# Patient Record
Sex: Female | Born: 1960 | Race: White | Hispanic: No | Marital: Married | Smoking: Never smoker
Health system: Southern US, Community
[De-identification: ages and names within clinical notes are randomized; demographics above are authoritative.]

## PROBLEM LIST (undated history)

## (undated) DIAGNOSIS — E213 Hyperparathyroidism, unspecified: Secondary | ICD-10-CM

## (undated) DIAGNOSIS — I10 Essential (primary) hypertension: Secondary | ICD-10-CM

## (undated) DIAGNOSIS — H9190 Unspecified hearing loss, unspecified ear: Secondary | ICD-10-CM

## (undated) DIAGNOSIS — Z974 Presence of external hearing-aid: Secondary | ICD-10-CM

## (undated) DIAGNOSIS — G4730 Sleep apnea, unspecified: Secondary | ICD-10-CM

## (undated) DIAGNOSIS — IMO0001 Reserved for inherently not codable concepts without codable children: Secondary | ICD-10-CM

## (undated) DIAGNOSIS — D649 Anemia, unspecified: Secondary | ICD-10-CM

## (undated) DIAGNOSIS — R12 Heartburn: Secondary | ICD-10-CM

## (undated) DIAGNOSIS — M1990 Unspecified osteoarthritis, unspecified site: Secondary | ICD-10-CM

## (undated) HISTORY — PX: WISDOM TOOTH EXTRACTION: SHX21

## (undated) HISTORY — DX: Anemia, unspecified: D64.9

## (undated) HISTORY — DX: Sleep apnea, unspecified: G47.30

## (undated) HISTORY — DX: Unspecified hearing loss, unspecified ear: H91.90

## (undated) HISTORY — PX: COLONOSCOPY: SHX174

## (undated) HISTORY — DX: Reserved for inherently not codable concepts without codable children: IMO0001

---

## 2011-08-03 ENCOUNTER — Encounter (HOSPITAL_COMMUNITY): Payer: Self-pay

## 2011-08-03 ENCOUNTER — Emergency Department (HOSPITAL_COMMUNITY)
Admission: EM | Admit: 2011-08-03 | Discharge: 2011-08-03 | Disposition: A | Discharge status: Home / Self Care | Attending: Emergency Medicine | Admitting: Emergency Medicine

## 2011-08-03 DIAGNOSIS — Z888 Allergy status to other drugs, medicaments and biological substances status: Secondary | ICD-10-CM | POA: Insufficient documentation

## 2011-08-03 DIAGNOSIS — I10 Essential (primary) hypertension: Secondary | ICD-10-CM | POA: Insufficient documentation

## 2011-08-03 HISTORY — DX: Essential (primary) hypertension: I10

## 2011-08-03 LAB — BASIC METABOLIC PANEL
CO2: 26 mEq/L (ref 19–32)
Calcium: 10 mg/dL (ref 8.4–10.5)
Glucose, Bld: 89 mg/dL (ref 70–99)
Sodium: 140 mEq/L (ref 135–145)

## 2011-08-03 MED ORDER — LISINOPRIL 10 MG PO TABS
10.0000 mg | ORAL_TABLET | Freq: Once | ORAL | Status: AC
  Administered 2011-08-03: 10 mg via ORAL
  Filled 2011-08-03: qty 1

## 2011-08-03 MED ORDER — LISINOPRIL 10 MG PO TABS: 10.0000 mg | ORAL_TABLET | Freq: Every day | ORAL | Status: DC

## 2011-08-03 MED ORDER — LISINOPRIL 10 MG PO TABS
ORAL_TABLET | ORAL | Status: AC
  Filled 2011-08-03: qty 1

## 2011-08-03 NOTE — ED Notes (Signed)
Pt says was at her eye doctor's office today and bp was over 200 systolic.  Pt says has had hypertension for several years but has not seen a doctor about it.  Pt denies other symptoms.  Says was at the eye doctor because it was a regular check up.

## 2011-08-06 NOTE — ED Provider Notes (Signed)
History     CSN: [id number]  Arrival date & time 08/03/11  1502   First MD Initiated Contact with Patient 08/03/11 1645      Chief Complaint  Patient presents with  . Hypertension    (Consider location/radiation/quality/duration/timing/severity/associated sxs/prior treatment) HPI Comments: Patient was at her routine yearly eye exam when her systolic bp was found to be over 200.  She was referred immediately here for further evaluation.    Patient is a 51 y.o. female presenting with hypertension. The history is provided by the patient.  Hypertension This is a chronic problem. Episode onset: She reports history of HTN,  having been on atenolol and maxide ,  but has taken no medications in over 3 years for this. Pertinent negatives include no abdominal pain, arthralgias, chest pain, congestion, fever, headaches, joint swelling, nausea, neck pain, numbness, rash, sore throat, urinary symptoms, visual change or weakness. Associated symptoms comments: She reports occasional peripheral edema, not currently..    Past Medical History  Diagnosis Date  . Hypertension     History reviewed. No pertinent past surgical history.  No family history on file.  History  Substance Use Topics  . Smoking status: Never Smoker   . Smokeless tobacco: Not on file  . Alcohol Use: No    OB History    Grav Para Term Preterm Abortions TAB SAB Ect Mult Living                  Review of Systems  Constitutional: Negative for fever.  HENT: Negative for congestion, sore throat and neck pain.   Eyes: Negative.  Negative for visual disturbance.  Respiratory: Negative for chest tightness and shortness of breath.   Cardiovascular: Negative for chest pain.  Gastrointestinal: Negative for nausea and abdominal pain.  Genitourinary: Negative.  Negative for hematuria, decreased urine volume and difficulty urinating.  Musculoskeletal: Negative for joint swelling and arthralgias.  Skin: Negative.  Negative for  rash and wound.  Neurological: Negative for dizziness, weakness, light-headedness, numbness and headaches.  Hematological: Negative.   Psychiatric/Behavioral: Negative.     Allergies  Ativan  Home Medications   Current Outpatient Rx  Name Route Sig Dispense Refill  . LISINOPRIL 10 MG PO TABS Oral Take 1 tablet (10 mg total) by mouth daily. 30 tablet 0    BP 221/96  Pulse 90  Temp 98.1 F (36.7 C) (Oral)  Resp 18  Ht 5\' 1"  (1.549 m)  Wt 213 lb (96.616 kg)  BMI 40.25 kg/m2  SpO2 100%  LMP 07/08/2011  Physical Exam  Nursing note and vitals reviewed. Constitutional: She appears well-developed and well-nourished.  HENT:  Head: Normocephalic and atraumatic.  Eyes: Conjunctivae and EOM are normal. Pupils are equal, round, and reactive to light.  Neck: Normal range of motion.  Cardiovascular: Normal rate, regular rhythm, normal heart sounds and intact distal pulses.   Pulmonary/Chest: Effort normal and breath sounds normal. She has no wheezes.  Abdominal: Soft. Bowel sounds are normal. There is no tenderness.  Musculoskeletal: Normal range of motion. She exhibits no edema.  Neurological: She is alert.  Skin: Skin is warm and dry.  Psychiatric: She has a normal mood and affect.    ED Course  Procedures (including critical care time)  Labs Reviewed  BASIC METABOLIC PANEL - Abnormal; Notable for the following:    GFR calc non Af Amer 72 (*)     GFR calc Af Amer 83 (*)     All other components within normal limits  LAB REPORT - SCANNED   No results found.   1. Hypertension       MDM  Reviewed lab results and discussed with pt.  Pt with apparent long standing HTN who has been non compliant with medication recommendations. She does have a pcp Dr Tanya Nones, but states has not seen in some time.  Strongly encouraged to get recheck by him within the next several weeks.  Prescribed lisinopril 10 mg daily.  Recommended pt to check her bp several times per week and discuss  these results with Dr. Tanya Nones who may adjust meds as needed.  Pt understands need for f/u and potential health risks of uncontrolled bp.  No evidence for acute end organ damage on today's exam or by history.        Burgess Amor, PA 08/06/11 1007

## 2011-08-07 NOTE — ED Provider Notes (Signed)
Medical screening examination/treatment/procedure(s) were performed by non-physician practitioner and as supervising physician I was immediately available for consultation/collaboration. Ashely Joshua, MD, FACEP   Ahsan Esterline L Shaliyah Taite, MD 08/07/11 2340 

## 2011-08-23 DIAGNOSIS — D649 Anemia, unspecified: Secondary | ICD-10-CM

## 2011-08-23 HISTORY — DX: Anemia, unspecified: D64.9

## 2012-10-13 ENCOUNTER — Ambulatory Visit (INDEPENDENT_AMBULATORY_CARE_PROVIDER_SITE_OTHER): Admitting: Physician Assistant

## 2012-10-13 ENCOUNTER — Encounter: Payer: Self-pay | Admitting: Physician Assistant

## 2012-10-13 VITALS — BP 118/80 | HR 100 | Temp 97.6°F | Resp 20 | Wt 206.0 lb

## 2012-10-13 DIAGNOSIS — H9193 Unspecified hearing loss, bilateral: Secondary | ICD-10-CM

## 2012-10-13 DIAGNOSIS — D649 Anemia, unspecified: Secondary | ICD-10-CM

## 2012-10-13 DIAGNOSIS — I10 Essential (primary) hypertension: Secondary | ICD-10-CM

## 2012-10-13 DIAGNOSIS — H9190 Unspecified hearing loss, unspecified ear: Secondary | ICD-10-CM

## 2012-10-13 DIAGNOSIS — IMO0001 Reserved for inherently not codable concepts without codable children: Secondary | ICD-10-CM | POA: Insufficient documentation

## 2012-10-13 LAB — CBC WITH DIFFERENTIAL/PLATELET
Eosinophils Absolute: 0.2 10*3/uL (ref 0.0–0.7)
Eosinophils Relative: 2 % (ref 0–5)
HCT: 42.4 % (ref 36.0–46.0)
Hemoglobin: 14.9 g/dL (ref 12.0–15.0)
Lymphs Abs: 2.1 10*3/uL (ref 0.7–4.0)
MCH: 30.3 pg (ref 26.0–34.0)
MCV: 86.2 fL (ref 78.0–100.0)
Monocytes Absolute: 0.5 10*3/uL (ref 0.1–1.0)
Monocytes Relative: 6 % (ref 3–12)
Platelets: 304 10*3/uL (ref 150–400)
RBC: 4.92 MIL/uL (ref 3.87–5.11)

## 2012-10-13 LAB — BASIC METABOLIC PANEL WITH GFR
BUN: 15 mg/dL (ref 6–23)
CO2: 26 mEq/L (ref 19–32)
Calcium: 10.8 mg/dL — ABNORMAL HIGH (ref 8.4–10.5)
Creat: 0.93 mg/dL (ref 0.50–1.10)
Glucose, Bld: 89 mg/dL (ref 70–99)

## 2012-10-13 NOTE — Progress Notes (Signed)
Patient ID: Courtney Hickman MRN: [id number], DOB: 09-06-1960, 52 y.o. Date of Encounter: 10/13/2012, 11:48 AM    Chief Complaint:  Chief Complaint  Patient presents with  . Follow-up    6 mos     HPI: 52 y.o. year old white female is very hard of hearing. She is here for routine followup.  She is taking all 3 blood pressure medicines as directed. Has no adverse effects. No lightheadedness. No lower extremity edema.  She does report that at the time that she was having problems with anemia in August 2013 she was having a lot of menstrual bleeding. Says that she was having a lot of spotting and bleeding even between the times of her regular cycle. Says that this has stopped since she had evaluation and procedure by GYN.( I reviewed her chart and I see that I still do not have those records).  She has no complaints today and says she's been feeling well.  Home Meds: See attached medication section for any medications that were entered at today's visit. The computer does not put those onto this list.The following list is a list of meds entered prior to today's visit.  Current meds: Lisinopril 20 mg 1 by mouth daily Amlodipine 10 mg 1 by mouth daily  HCTZ 25 mg 1 by mouth daily  No current outpatient prescriptions on file prior to visit.   No current facility-administered medications on file prior to visit.    Allergies:  Allergies  Allergen Reactions  . Ativan [Lorazepam] Other (See Comments)    REACTION: Headache, eye pain      Review of Systems: See HPI for pertinent ROS. All other ROS negative.    Physical Exam: Blood pressure 118/80, pulse 100, temperature 97.6 F (36.4 C), temperature source Oral, resp. rate 20, weight 206 lb (93.441 kg)., Body mass index is 38.94 kg/(m^2). General: Obese white female.  Appears in no acute distress. Neck: Supple. No thyromegaly. No lymphadenopathy. No carotid bruits. Lungs: Clear bilaterally to auscultation without wheezes, rales, or  rhonchi. Breathing is unlabored. Heart: Regular rhythm. No murmurs, rubs, or gallops. Abdomen: Soft, non-tender, non-distended with normoactive bowel sounds. No hepatomegaly. No rebound/guarding. No obvious abdominal masses. Msk:  Strength and tone normal for age. Extremities/Skin: Warm and dry. No clubbing or cyanosis. No edema.  Neuro: Alert and oriented X 3. Moves all extremities spontaneously. Gait is normal. CNII-XII grossly in tact. Psych:  Responds to questions appropriately with a normal affect.     ASSESSMENT AND PLAN:  52 y.o. year old female with  1. Hypertension At goal. Continue current medications - BASIC METABOLIC PANEL WITH GFR  2. Anemia August 2013 she was found to have anemia. She underwent GI evaluation by Dr. Audley Hose. A: she had colonoscopy on 10/04/11. This showed diverticula. Hemorrhoids. Otherwise normal. Repeat 10 years. B: She underwent endoscopy 10/04/11 which was normal C : capsule endoscopy 10/04/11 normal  As well she had GYN evaluation. I still do not have his records. The patient does report to me that at that time in August 2013 she was having menstrual bleeding between her periods. Also says the GYN removed a uterine fibroid. We'll recheck CBC now to make sure no anemia at this time. - CBC with Differential  3. Hearing impairment, bilateral 4. screening mammogram: She had this October 2013. She says that she's gotten a letter to schedule followup. I have encouraged her to call to follow this up. Screening lipid panel , CMET, TSH 8/13 normal  Immunizations: Tetanus was given here 09/19/11 Flu: Give now  Regular office visit 6 months or sooner if needed   Signed, [facility] [place], [place], [facility] 10/13/2012 11:48 AM

## 2012-10-20 ENCOUNTER — Encounter: Payer: Self-pay | Admitting: Family Medicine

## 2012-10-20 ENCOUNTER — Telehealth: Payer: Self-pay | Admitting: Physician Assistant

## 2012-10-20 MED ORDER — AMLODIPINE BESYLATE 10 MG PO TABS: 10.0000 mg | ORAL_TABLET | Freq: Every day | ORAL | Status: DC

## 2012-10-20 MED ORDER — HYDROCHLOROTHIAZIDE 25 MG PO TABS: 25.0000 mg | ORAL_TABLET | Freq: Every day | ORAL | Status: DC

## 2012-10-20 MED ORDER — LISINOPRIL 20 MG PO TABS: 20.0000 mg | ORAL_TABLET | Freq: Every day | ORAL | Status: DC

## 2012-10-20 NOTE — Telephone Encounter (Signed)
Amlodipine 10 mg HCTZ 25 mg Lisinopril 20 mg

## 2012-10-20 NOTE — Telephone Encounter (Signed)
Medication refilled per protocol. 

## 2013-01-05 ENCOUNTER — Ambulatory Visit (INDEPENDENT_AMBULATORY_CARE_PROVIDER_SITE_OTHER): Admitting: Family Medicine

## 2013-01-05 ENCOUNTER — Encounter: Payer: Self-pay | Admitting: Family Medicine

## 2013-01-05 VITALS — BP 130/80 | HR 78 | Temp 97.5°F | Resp 18 | Wt 208.0 lb

## 2013-01-05 DIAGNOSIS — J029 Acute pharyngitis, unspecified: Secondary | ICD-10-CM | POA: Insufficient documentation

## 2013-01-05 DIAGNOSIS — J069 Acute upper respiratory infection, unspecified: Secondary | ICD-10-CM

## 2013-01-05 MED ORDER — GUAIFENESIN-CODEINE 100-10 MG/5ML PO SOLN: 10.0000 mL | Freq: Four times a day (QID) | ORAL | Status: DC | PRN

## 2013-01-05 MED ORDER — AMOXICILLIN 500 MG PO CAPS: 500.0000 mg | ORAL_CAPSULE | Freq: Three times a day (TID) | ORAL | Status: DC

## 2013-01-05 NOTE — Assessment & Plan Note (Signed)
Per above also give Robitussin with codeine

## 2013-01-05 NOTE — Patient Instructions (Signed)
Take antibiotics as prescribed Robitussin with codiene F/U as needed

## 2013-01-05 NOTE — Assessment & Plan Note (Signed)
Her strep screen is negative however  based on exam and worsening symptoms I will treat her for pharyngitis. I will put her on amoxicillin

## 2013-01-05 NOTE — Progress Notes (Signed)
   Subjective:    Patient ID: Courtney Hickman, female    DOB: Jun 13, 1960, 52 y.o.   MRN: [id number]  HPI Patient here secondary to sore throat and cough with production for the past 8 days. She denies any fever. She's not had any sinus drainage. Her more concern is her throat is very painful with swallowing. She's been using Tussend DM with some improvement in her cough as it does break up the mucus.   She denies any shortness of breath or wheezing.   Review of Systems - per above   GEN- denies fatigue, fever, weight loss,weakness, recent illness HEENT- denies eye drainage, change in vision, nasal discharge, CVS- denies chest pain, palpitations RESP- denies SOB,+ cough, wheeze Neuro- denies headache, dizziness, syncope, seizure activity      Objective:   Physical Exam  GEN- NAD, alert and oriented x3 HEENT- PERRL, EOMI, non injected sclera, pink conjunctiva, MMM, post-oropharynx +injection/, no exudates TM clear bilat no effusion, no maxillary sinus tenderness, nares clear Neck- Supple, Shotty LAD CVS- RRR, no murmur RESP-CTAB EXT- No edema Pulses- Radial 2+         Assessment & Plan:

## 2013-04-13 ENCOUNTER — Ambulatory Visit: Admitting: Physician Assistant

## 2013-04-21 ENCOUNTER — Other Ambulatory Visit: Payer: Self-pay | Admitting: Physician Assistant

## 2013-04-22 NOTE — Telephone Encounter (Signed)
Refills sent for one month until follow up appt

## 2013-04-27 ENCOUNTER — Encounter: Payer: Self-pay | Admitting: Physician Assistant

## 2013-04-27 ENCOUNTER — Ambulatory Visit (INDEPENDENT_AMBULATORY_CARE_PROVIDER_SITE_OTHER): Admitting: Physician Assistant

## 2013-04-27 VITALS — BP 128/88 | HR 88 | Temp 98.6°F | Resp 18 | Ht 60.0 in | Wt 210.0 lb

## 2013-04-27 DIAGNOSIS — I10 Essential (primary) hypertension: Secondary | ICD-10-CM

## 2013-04-27 DIAGNOSIS — H9190 Unspecified hearing loss, unspecified ear: Secondary | ICD-10-CM

## 2013-04-27 DIAGNOSIS — D649 Anemia, unspecified: Secondary | ICD-10-CM

## 2013-04-27 DIAGNOSIS — E669 Obesity, unspecified: Secondary | ICD-10-CM

## 2013-04-27 LAB — BASIC METABOLIC PANEL WITH GFR
BUN: 11 mg/dL (ref 6–23)
CALCIUM: 10.6 mg/dL — AB (ref 8.4–10.5)
CO2: 26 mEq/L (ref 19–32)
Chloride: 103 mEq/L (ref 96–112)
Creat: 0.82 mg/dL (ref 0.50–1.10)
GFR, EST NON AFRICAN AMERICAN: 83 mL/min
GLUCOSE: 83 mg/dL (ref 70–99)
Potassium: 4.2 mEq/L (ref 3.5–5.3)
SODIUM: 139 meq/L (ref 135–145)

## 2013-04-27 MED ORDER — AMLODIPINE BESYLATE 10 MG PO TABS: ORAL_TABLET | ORAL | Status: DC

## 2013-04-27 MED ORDER — HYDROCHLOROTHIAZIDE 25 MG PO TABS: ORAL_TABLET | ORAL | Status: DC

## 2013-04-27 MED ORDER — LISINOPRIL 20 MG PO TABS: 20.0000 mg | ORAL_TABLET | Freq: Every day | ORAL | Status: DC

## 2013-04-27 NOTE — Progress Notes (Signed)
    Patient ID: Courtney Hickman MRN: [id number], DOB: Jul 30, 1960, 53 y.o. Date of Encounter: 04/27/2013, 11:56 AM    Chief Complaint:  Chief Complaint  Patient presents with  . 6 mth check up    not fasting     HPI: 53 y.o. year old female --very hard of hearing--for followup of hypertension. says she is still taking all 3 blood pressure medicines as directed. Still having no adverse effects. No lightheadedness and no lower extremity edema.     Home Meds: See attached medication section for any medications that were entered at today's visit. The computer does not put those onto this list.The following list is a list of meds entered prior to today's visit.   No current outpatient prescriptions on file prior to visit.   No current facility-administered medications on file prior to visit.    Allergies:  Allergies  Allergen Reactions  . Actifed Cold-Allergy [Chlorpheniramine-Phenyleph Er] Anaphylaxis    Eyes hurt / headache      Review of Systems: See HPI for pertinent ROS. All other ROS negative.    Physical Exam: Blood pressure 128/88, pulse 88, temperature 98.6 F (37 C), temperature source Oral, resp. rate 18, height 5' (1.524 m), weight 210 lb (95.255 kg), last menstrual period 04/06/2013., Body mass index is 41.01 kg/(m^2). General:  Obese WF. Appears in no acute distress. Neck: Supple. No thyromegaly. No lymphadenopathy. No carotid bruits. Lungs: Clear bilaterally to auscultation without wheezes, rales, or rhonchi. Breathing is unlabored. Heart: Regular rhythm. No murmurs, rubs, or gallops. Msk:  Strength and tone normal for age. Extremities/Skin: Warm and dry. No edema.  Neuro: Alert and oriented X 3. Moves all extremities spontaneously. Gait is normal. CNII-XII grossly in tact. Psych:  Responds to questions appropriately with a normal affect.     ASSESSMENT AND PLAN:  53 y.o. year old female with  1. Hearing impairment  2. Hypertension Blood pressure is at  goal. Cont current medications. Check labs to monitor. - BASIC METABOLIC PANEL WITH GFR - lisinopril (PRINIVIL,ZESTRIL) 20 MG tablet; Take 1 tablet (20 mg total) by mouth daily.  Dispense: 30 tablet; Refill: 5 - hydrochlorothiazide (HYDRODIURIL) 25 MG tablet; TAKE ONE TABLET BY MOUTH ONCE DAILY  Dispense: 30 tablet; Refill: 5 - amLODipine (NORVASC) 10 MG tablet; TAKE ONE TABLET BY MOUTH ONCE DAILY  Dispense: 30 tablet; Refill: 5  3. Obesity, unspecified  4. Anemia Last office visit 09/2012 checked CBC to followup. Blood count is now normal and there is no anemia. Please see that note for details of her history of anemia.  Immunizations: Tetanus was given here 09/19/2011   Routine office visit 6 months or sooner if needed.     Signed, [address] [place], [place], [facility] 04/27/2013 11:56 AM

## 2013-05-05 ENCOUNTER — Encounter: Payer: Self-pay | Admitting: Family Medicine

## 2013-06-23 ENCOUNTER — Encounter: Payer: Self-pay | Admitting: Family Medicine

## 2013-06-23 LAB — HM MAMMOGRAPHY: HM Mammogram: NORMAL

## 2013-10-28 ENCOUNTER — Encounter: Payer: Self-pay | Admitting: Physician Assistant

## 2013-10-28 ENCOUNTER — Ambulatory Visit (INDEPENDENT_AMBULATORY_CARE_PROVIDER_SITE_OTHER): Admitting: Physician Assistant

## 2013-10-28 VITALS — BP 112/76 | HR 96 | Temp 97.9°F | Resp 20 | Wt 209.0 lb

## 2013-10-28 DIAGNOSIS — H9190 Unspecified hearing loss, unspecified ear: Secondary | ICD-10-CM

## 2013-10-28 DIAGNOSIS — Z23 Encounter for immunization: Secondary | ICD-10-CM

## 2013-10-28 DIAGNOSIS — L239 Allergic contact dermatitis, unspecified cause: Secondary | ICD-10-CM

## 2013-10-28 DIAGNOSIS — L200 Besnier's prurigo: Secondary | ICD-10-CM

## 2013-10-28 DIAGNOSIS — I10 Essential (primary) hypertension: Secondary | ICD-10-CM

## 2013-10-28 DIAGNOSIS — E669 Obesity, unspecified: Secondary | ICD-10-CM

## 2013-10-28 LAB — BASIC METABOLIC PANEL WITH GFR
BUN: 15 mg/dL (ref 6–23)
CALCIUM: 10.2 mg/dL (ref 8.4–10.5)
CO2: 24 mEq/L (ref 19–32)
Chloride: 103 mEq/L (ref 96–112)
Creat: 1.02 mg/dL (ref 0.50–1.10)
GFR, EST AFRICAN AMERICAN: 74 mL/min
GFR, EST NON AFRICAN AMERICAN: 64 mL/min
GLUCOSE: 115 mg/dL — AB (ref 70–99)
Potassium: 4 mEq/L (ref 3.5–5.3)
SODIUM: 139 meq/L (ref 135–145)

## 2013-10-28 MED ORDER — PREDNISONE 20 MG PO TABS: ORAL_TABLET | ORAL | Status: DC

## 2013-10-28 NOTE — Progress Notes (Signed)
Patient ID: Courtney Hickman MRN: [id number], DOB: 1960-10-29, 53 y.o. Date of Encounter: 10/28/2013, 10:28 AM    Chief Complaint:  Chief Complaint  Patient presents with  . 53 mth check up    not fasting  . rash all over x 1 day     HPI: 53 y.o. year old female --very hard of hearing-- here for followup of hypertension.  She says she just started to develop this rash last night. Says that it started on her lower legs last night. Now it has spread up her legs and also is now on both of her arms her abdomen and chest. Says it is itchy. Says the only thing she can think of is that her husband recently bought a new different type of detergent. He recently washed some towels with that detergent and she thinks that she may have used one of those towels. She can think of no other new products or foods that she has been in contact with.  Says she is still taking all 3 blood pressure medicines as directed. Still having no adverse effects. No lightheadedness and no lower extremity edema.     Home Meds:    Outpatient Prescriptions Prior to Visit  Medication Sig Dispense Refill  . amLODipine (NORVASC) 10 MG tablet TAKE ONE TABLET BY MOUTH ONCE DAILY  30 tablet  5  . hydrochlorothiazide (HYDRODIURIL) 25 MG tablet TAKE ONE TABLET BY MOUTH ONCE DAILY  30 tablet  5  . lisinopril (PRINIVIL,ZESTRIL) 20 MG tablet Take 1 tablet (20 mg total) by mouth daily.  30 tablet  5  . Multiple Vitamin (MULTIVITAMIN) tablet Take 1 tablet by mouth daily.       No facility-administered medications prior to visit.     Allergies:  Allergies  Allergen Reactions  . Actifed Cold-Allergy [Chlorpheniramine-Phenyleph Er] Anaphylaxis    Eyes hurt / headache      Review of Systems: See HPI for pertinent ROS. All other ROS negative.    Physical Exam: Blood pressure 112/76, pulse 96, temperature 97.9 F (36.6 C), temperature source Oral, resp. rate 20, weight 209 lb (94.802 kg)., Body mass index is 40.82  kg/(m^2). General:  Obese WF. Appears in no acute distress. Neck: Supple. No thyromegaly. No lymphadenopathy. No carotid bruits. Lungs: Clear bilaterally to auscultation without wheezes, rales, or rhonchi. Breathing is unlabored. Heart: Regular rhythm. No murmurs, rubs, or gallops. Msk:  Strength and tone normal for age. Extremities/Skin: Warm and dry. No edema.  Pink splotchy papules/ urticaria scattered on both legs, both arms, abdomen. Some of the areas on her arms are especially urticarial and small whelps.  Neuro: Alert and oriented X 3. Moves all extremities spontaneously. Gait is normal. CNII-XII grossly in tact. Psych:  Responds to questions appropriately with a normal affect.     ASSESSMENT AND PLAN:  53 y.o. year old female with  1. Hearing impairment  2. Hypertension Blood pressure is at goal. Cont current medications. Check labs to monitor. - BASIC METABOLIC PANEL WITH GFR - lisinopril (PRINIVIL,ZESTRIL) 20 MG tablet; Take 1 tablet (20 mg total) by mouth daily.  Dispense: 30 tablet; Refill: 5 - hydrochlorothiazide (HYDRODIURIL) 25 MG tablet; TAKE ONE TABLET BY MOUTH ONCE DAILY  Dispense: 30 tablet; Refill: 5 - amLODipine (NORVASC) 10 MG tablet; TAKE ONE TABLET BY MOUTH ONCE DAILY  Dispense: 30 tablet; Refill: 5  3. Obesity, unspecified  4. Anemia  09/2012 checked CBC to followup. Blood count  normal and there was no anemia. Please  see that note for details of her history of anemia.  2. Allergic dermatitis - predniSONE (DELTASONE) 20 MG tablet; Take 3 daily for 2 days, then 2 daily for 2 days, then 1 daily for 2 days.  Dispense: 12 tablet; Refill: 0 She is to take the prednisone as directed. She is to follow up if rash does not resolve after completion of prednisone. She is to throw away the detergent that her husband bought and not use it any further.   Immunizations: Tetanus was given here 09/19/2011 Test influenza vaccine today and she'll receive influenza vaccine  this will be given today.  Routine office visit 6 months or sooner if needed.     Signed, [address] [place], [place], [facility] 10/28/2013 10:28 AM

## 2013-10-29 ENCOUNTER — Encounter: Payer: Self-pay | Admitting: Family Medicine

## 2013-11-02 ENCOUNTER — Other Ambulatory Visit: Payer: Self-pay | Admitting: Physician Assistant

## 2013-11-26 ENCOUNTER — Telehealth: Payer: Self-pay | Admitting: Family Medicine

## 2013-11-26 MED ORDER — LISINOPRIL 20 MG PO TABS: 20.0000 mg | ORAL_TABLET | Freq: Every day | ORAL | Status: DC

## 2013-11-26 NOTE — Telephone Encounter (Signed)
Medication refilled per protocol. 

## 2013-12-20 ENCOUNTER — Other Ambulatory Visit: Payer: Self-pay | Admitting: Physician Assistant

## 2013-12-20 DIAGNOSIS — I10 Essential (primary) hypertension: Secondary | ICD-10-CM

## 2013-12-21 NOTE — Telephone Encounter (Signed)
Medication refilled per protocol. 

## 2014-04-29 ENCOUNTER — Ambulatory Visit (INDEPENDENT_AMBULATORY_CARE_PROVIDER_SITE_OTHER): Admitting: Physician Assistant

## 2014-04-29 ENCOUNTER — Encounter: Payer: Self-pay | Admitting: Physician Assistant

## 2014-04-29 VITALS — BP 122/82 | HR 92 | Temp 98.3°F | Resp 20 | Ht 60.0 in | Wt 212.0 lb

## 2014-04-29 DIAGNOSIS — D649 Anemia, unspecified: Secondary | ICD-10-CM | POA: Diagnosis not present

## 2014-04-29 DIAGNOSIS — E669 Obesity, unspecified: Secondary | ICD-10-CM

## 2014-04-29 DIAGNOSIS — H9190 Unspecified hearing loss, unspecified ear: Secondary | ICD-10-CM

## 2014-04-29 DIAGNOSIS — I10 Essential (primary) hypertension: Secondary | ICD-10-CM

## 2014-04-29 MED ORDER — LISINOPRIL 20 MG PO TABS: 20.0000 mg | ORAL_TABLET | Freq: Every day | ORAL | Status: DC

## 2014-04-29 NOTE — Progress Notes (Signed)
Patient ID: DISA RIEDLINGER MRN: [id number], DOB: 08-24-1960, 54 y.o. Date of Encounter: 04/29/2014, 8:55 AM    Chief Complaint:  Chief Complaint  Patient presents with  . 6 mth check up    not fasting     HPI: 54 y.o. year old female --very hard of hearing-- here for followup of hypertension.   Says she is still taking all 3 blood pressure medicines as directed. Still having no adverse effects. No lightheadedness and no lower extremity edema. No  complaints or concerns today.     Home Meds:    Outpatient Prescriptions Prior to Visit  Medication Sig Dispense Refill  . amLODipine (NORVASC) 10 MG tablet TAKE ONE TABLET BY MOUTH ONCE DAILY 30 tablet 5  . Ferrous Sulfate (IRON) 325 (65 FE) MG TABS Take 1 tablet by mouth daily.    . hydrochlorothiazide (HYDRODIURIL) 25 MG tablet TAKE ONE TABLET BY MOUTH ONCE DAILY 30 tablet 5  . lisinopril (PRINIVIL,ZESTRIL) 20 MG tablet Take 1 tablet (20 mg total) by mouth daily. 30 tablet 5  . Multiple Vitamin (MULTIVITAMIN) tablet Take 1 tablet by mouth daily.    . predniSONE (DELTASONE) 20 MG tablet Take 3 daily for 2 days, then 2 daily for 2 days, then 1 daily for 2 days. 12 tablet 0   No facility-administered medications prior to visit.     Allergies:  Allergies  Allergen Reactions  . Actifed Cold-Allergy [Chlorpheniramine-Phenyleph Er] Anaphylaxis    Eyes hurt / headache      Review of Systems: See HPI for pertinent ROS. All other ROS negative.    Physical Exam: Blood pressure 122/82, pulse 92, temperature 98.3 F (36.8 C), temperature source Oral, resp. rate 20, height 5' (1.524 m), weight 212 lb (96.163 kg)., Body mass index is 41.4 kg/(m^2). General:  Obese WF. Appears in no acute distress. Neck: Supple. No thyromegaly. No lymphadenopathy. No carotid bruits. Lungs: Clear bilaterally to auscultation without wheezes, rales, or rhonchi. Breathing is unlabored. Heart: Regular rhythm. No murmurs, rubs, or gallops. Msk:  Strength  and tone normal for age. Extremities/Skin: Warm and dry. No edema.   Neuro: Alert and oriented X 3. Moves all extremities spontaneously. Gait is normal. CNII-XII grossly in tact. Psych:  Responds to questions appropriately with a normal affect.     ASSESSMENT AND PLAN:  54 y.o. year old female with  1. Hearing impairment  2. Hypertension Blood pressure is at goal. Cont current medications.   - lisinopril (PRINIVIL,ZESTRIL) 20 MG tablet; Take 1 tablet (20 mg total) by mouth daily.  Dispense: 30 tablet; Refill: 5 - hydrochlorothiazide (HYDRODIURIL) 25 MG tablet; TAKE ONE TABLET BY MOUTH ONCE DAILY  Dispense: 30 tablet; Refill: 5 - amLODipine (NORVASC) 10 MG tablet; TAKE ONE TABLET BY MOUTH ONCE DAILY  Dispense: 30 tablet; Refill: 5  3. Obesity, unspecified  4. Anemia  09/2012 checked CBC to followup. Blood count  normal and there was no anemia. Please see that note for details of her history of anemia.   Immunizations: Influenza Vaccine: Given here 10/28/2013 Tetanus Vaccine:  Given here 09/19/2011   Today discussed that we need to update preventive care. Patient agreeable that she has not had a complete physical in quite some time and is agreeable to schedule this. I discussed that if she is agreeable to return in the next couple weeks for physical that we can just wait to do all lab work at that time instead of doing bmet today and then repeating labs at  physical. She is agreeable to schedule her physical within the next couple weeks and will come fasting to that appt.      Signed, [address] [place], [place], [facility] 04/29/2014 8:55 AM

## 2014-05-13 ENCOUNTER — Encounter: Payer: Self-pay | Admitting: Physician Assistant

## 2014-05-13 ENCOUNTER — Encounter: Admitting: Physician Assistant

## 2014-05-13 VITALS — BP 126/88 | HR 96 | Temp 98.0°F | Resp 18 | Ht 59.5 in | Wt 207.0 lb

## 2014-05-17 ENCOUNTER — Ambulatory Visit (INDEPENDENT_AMBULATORY_CARE_PROVIDER_SITE_OTHER): Admitting: Physician Assistant

## 2014-05-17 ENCOUNTER — Encounter: Payer: Self-pay | Admitting: Physician Assistant

## 2014-05-17 VITALS — BP 124/88 | HR 80 | Temp 98.1°F | Resp 18 | Ht 59.5 in | Wt 207.0 lb

## 2014-05-17 DIAGNOSIS — D649 Anemia, unspecified: Secondary | ICD-10-CM

## 2014-05-17 DIAGNOSIS — Z0000 Encounter for general adult medical examination without abnormal findings: Secondary | ICD-10-CM

## 2014-05-17 DIAGNOSIS — H9190 Unspecified hearing loss, unspecified ear: Secondary | ICD-10-CM

## 2014-05-17 DIAGNOSIS — E669 Obesity, unspecified: Secondary | ICD-10-CM

## 2014-05-17 DIAGNOSIS — I10 Essential (primary) hypertension: Secondary | ICD-10-CM | POA: Diagnosis not present

## 2014-05-17 LAB — CBC WITH DIFFERENTIAL/PLATELET
BASOS PCT: 0 % (ref 0–1)
Basophils Absolute: 0 10*3/uL (ref 0.0–0.1)
Eosinophils Absolute: 0.2 10*3/uL (ref 0.0–0.7)
Eosinophils Relative: 2 % (ref 0–5)
HEMATOCRIT: 42.2 % (ref 36.0–46.0)
HEMOGLOBIN: 14.8 g/dL (ref 12.0–15.0)
LYMPHS ABS: 1.8 10*3/uL (ref 0.7–4.0)
Lymphocytes Relative: 23 % (ref 12–46)
MCH: 31.5 pg (ref 26.0–34.0)
MCHC: 35.1 g/dL (ref 30.0–36.0)
MCV: 89.8 fL (ref 78.0–100.0)
MONO ABS: 0.4 10*3/uL (ref 0.1–1.0)
MPV: 10.4 fL (ref 8.6–12.4)
Monocytes Relative: 5 % (ref 3–12)
NEUTROS ABS: 5.6 10*3/uL (ref 1.7–7.7)
NEUTROS PCT: 70 % (ref 43–77)
Platelets: 260 10*3/uL (ref 150–400)
RBC: 4.7 MIL/uL (ref 3.87–5.11)
RDW: 15.1 % (ref 11.5–15.5)
WBC: 8 10*3/uL (ref 4.0–10.5)

## 2014-05-17 NOTE — Progress Notes (Signed)
This encounter was created in error - please disregard.

## 2014-05-17 NOTE — Progress Notes (Addendum)
Patient ID: JAILA SCHELLHORN MRN: [id number], DOB: 02/06/1960, 54 y.o. Date of Encounter: 05/17/2014,   Chief Complaint: Physical (CPE)  HPI: 54 y.o. y/o female  here for [place] HEARING   She says she has been having some mild headaches recently. Across both temples, that area.  No increased stress compared to usual.  No sinus symptoms--no rhinorrhea or mucus from nose, no nasal congestion etc.  Has had recent eye exam.  Occasionally bad enough to take otc ibuprofen--h/a resolves with this.   No other complaints or concerns.   Review of Systems: Consitutional: No fever, chills, fatigue, night sweats, lymphadenopathy. No significant/unexplained weight changes. Eyes: No visual changes, eye redness, or discharge. ENT/Mouth: No ear pain, sore throat, nasal drainage, or sinus pain. Cardiovascular: No chest pressure,heaviness, tightness or squeezing, even with exertion. No increased shortness of breath or dyspnea on exertion.No palpitations, edema, orthopnea, PND. Respiratory: No cough, hemoptysis, SOB, or wheezing. Gastrointestinal: No anorexia, dysphagia, reflux, pain, nausea, vomiting, hematemesis, diarrhea, constipation, BRBPR, or melena. Breast: No mass, nodules, bulging, or retraction. No skin changes or inflammation. No nipple discharge. No lymphadenopathy. Genitourinary: No dysuria, hematuria, incontinence, vaginal discharge, pruritis, burning, abnormal bleeding, or pain. Musculoskeletal: No decreased ROM, No joint pain or swelling. No significant pain in neck, back, or extremities. Skin: No rash, pruritis, or concerning lesions. Neurological: No headache, dizziness, syncope, seizures, tremors, memory loss, coordination problems, or paresthesias. Psychological: No anxiety, depression, hallucinations, SI/HI. Endocrine: No polydipsia, polyphagia, polyuria, or known diabetes.No increased fatigue. No palpitations/rapid heart rate. No significant/unexplained weight  change. All other systems were reviewed and are otherwise negative.  Past Medical History  Diagnosis Date  . Hypertension   . Hearing impairment   . Anemia 08/23/2011    Dr. Benson Norway: EGD, Colonoscopy, Capsule Endo--all negative  . Anemia     Had metrorhagia at that time     History reviewed. No pertinent past surgical history.  Home Meds:  Outpatient Prescriptions Prior to Visit  Medication Sig Dispense Refill  . amLODipine (NORVASC) 10 MG tablet TAKE ONE TABLET BY MOUTH ONCE DAILY 30 tablet 5  . Ferrous Sulfate (IRON) 325 (65 FE) MG TABS Take 1 tablet by mouth daily.    . hydrochlorothiazide (HYDRODIURIL) 25 MG tablet TAKE ONE TABLET BY MOUTH ONCE DAILY 30 tablet 5  . lisinopril (PRINIVIL,ZESTRIL) 20 MG tablet Take 1 tablet (20 mg total) by mouth daily. 30 tablet 5  . Multiple Vitamin (MULTIVITAMIN) tablet Take 1 tablet by mouth daily.     No facility-administered medications prior to visit.    Allergies:  Allergies  Allergen Reactions  . Actifed Cold-Allergy [Chlorpheniramine-Phenyleph Er] Anaphylaxis    Eyes hurt / headache    History   Social History  . Marital Status: Single    Spouse Name: N/A  . Number of Children: N/A  . Years of Education: N/A   Occupational History  . Not on file.   Social History Main Topics  . Smoking status: Never Smoker   . Smokeless tobacco: Never Used  . Alcohol Use: No  . Drug Use: No  . Sexual Activity: Not on file   Other Topics Concern  . Not on file   Social History Narrative   Married. HomeMaker.   3 children; 16, 41, 54 y/o    History reviewed. No pertinent family history.  Physical Exam: Blood pressure 124/88, pulse 80, temperature 98.1 F (36.7 C), temperature source Oral, resp. rate 18, height 4' 11.5" (1.511 m),  weight 207 lb (93.895 kg), last menstrual period 05/10/2014., Body mass index is 41.13 kg/(m^2). General: Obese WF. Appears in no acute distress. VERY HARD OF HEARING HEENT: Normocephalic, atraumatic.  Conjunctiva pink, sclera non-icteric. Pupils 2 mm constricting to 1 mm, round, regular, and equally reactive to light and accomodation. EOMI. Hearing aids, bilaterally.  Internal auditory canal clear. TMs with good cone of light and without pathology. Nasal mucosa pink. Nares are without discharge. No sinus tenderness. Oral mucosa pink.  Pharynx without exudate.   Neck: Supple. Trachea midline. No thyromegaly. Full ROM. No lymphadenopathy.No Carotid Bruits. Lungs: Clear to auscultation bilaterally without wheezes, rales, or rhonchi. Breathing is of normal effort and unlabored. Cardiovascular: RRR with S1 S2. No murmurs, rubs, or gallops. Distal pulses 2+ symmetrically. No carotid or abdominal bruits. Breast: Symmetrical. No masses. Nipples without discharge. Abdomen: Soft, non-tender, non-distended with normoactive bowel sounds. No hepatosplenomegaly or masses. No rebound/guarding. No CVA tenderness. No hernias.  Genitourinary:  External genitalia without lesions. Vaginal mucosa pink.No discharge present. Cervix pink and without discharge. No cervical tenderness.Normal uterus size. No adnexal mass or tenderness.  Pap smear taken. Musculoskeletal: Full range of motion and 5/5 strength throughout. Without swelling, atrophy, tenderness, crepitus, or warmth. Extremities without clubbing, cyanosis, or edema.  Skin: Warm and moist without erythema, ecchymosis, wounds, or rash. Neuro: A+Ox3. CN II-XII grossly intact. Moves all extremities spontaneously. Full sensation throughout. Normal gait. DTR 2+ throughout upper and lower extremities.  Psych:  Responds to questions appropriately with a normal affect.   Assessment/Plan:  54 y.o. y/o female here for CPE 1. Visit for preventive health examination  A. Screening Labs: - CBC with Differential/Platelet - COMPLETE METABOLIC PANEL WITH GFR - Lipid panel - TSH - Vit D  25 hydroxy (rtn osteoporosis monitoring)  B. Pap: - PAP, Thin Prep w/HPV rflx HPV Type  16/18   C. Screening Mammogram: Had 06/23/2013--Negative Addendum--Added 09/20/2014---Received Mammogram report from [facility]---09/07/2014--Normal/Negative  D. DEXA/BMD:  Can wait until age 54.  E. Colorectal Cancer Screening: Had Colonoscopy 10/04/2011---Per Pt--Normal--Repeat 10 years.   F. Immunizations:  Influenza:  N/A Tetanus:  09/19/2011 Pneumococcal:  No indication until age 63 Zostavax:        Not indicated until age 54    2. Essential hypertension At goal. - COMPLETE METABOLIC PANEL WITH GFR  3. Hearing impairment, unspecified laterality  4. Anemia, unspecified anemia type - CBC with Differential/Platelet  5. Obesity 6. Headaches--If worsen, f/u. currnently very rare, very minimal discomfort, controlled with otc NSAIDs. Not c/w tension  H/a or sinus h/a or migraine. Not cluster h/a or temporal arteritis.   Marin Olp [place], [place], [facility] 05/17/2014 10:48 AM

## 2014-05-18 LAB — LIPID PANEL
CHOL/HDL RATIO: 4.4 ratio
CHOLESTEROL: 160 mg/dL (ref 0–200)
HDL: 36 mg/dL — AB (ref >=46)
LDL Cholesterol: 88 mg/dL (ref 0–99)
Triglycerides: 180 mg/dL — ABNORMAL HIGH (ref <150)
VLDL: 36 mg/dL (ref 0–40)

## 2014-05-18 LAB — COMPLETE METABOLIC PANEL WITH GFR
ALBUMIN: 4 g/dL (ref 3.5–5.2)
ALT: 31 U/L (ref 0–35)
AST: 19 U/L (ref 0–37)
Alkaline Phosphatase: 45 U/L (ref 39–117)
BUN: 11 mg/dL (ref 6–23)
CALCIUM: 11 mg/dL — AB (ref 8.4–10.5)
CO2: 26 mEq/L (ref 19–32)
Chloride: 99 mEq/L (ref 96–112)
Creat: 1.03 mg/dL (ref 0.50–1.10)
GFR, Est African American: 72 mL/min
GFR, Est Non African American: 63 mL/min
Glucose, Bld: 86 mg/dL (ref 70–99)
Potassium: 3.8 mEq/L (ref 3.5–5.3)
Sodium: 136 mEq/L (ref 135–145)
Total Bilirubin: 0.5 mg/dL (ref 0.2–1.2)
Total Protein: 6.9 g/dL (ref 6.0–8.3)

## 2014-05-18 LAB — TSH: TSH: 0.917 u[IU]/mL (ref 0.350–4.500)

## 2014-05-18 LAB — VITAMIN D 25 HYDROXY (VIT D DEFICIENCY, FRACTURES): VIT D 25 HYDROXY: 35 ng/mL (ref 30–100)

## 2014-05-19 ENCOUNTER — Encounter: Payer: Self-pay | Admitting: Family Medicine

## 2014-05-19 LAB — PAP, THIN PREP W/HPV RFLX HPV TYPE 16/18: HPV DNA High Risk: NOT DETECTED

## 2014-05-31 ENCOUNTER — Encounter: Payer: Self-pay | Admitting: Physician Assistant

## 2014-06-18 ENCOUNTER — Other Ambulatory Visit: Payer: Self-pay | Admitting: Physician Assistant

## 2014-07-01 ENCOUNTER — Encounter: Payer: Self-pay | Admitting: Physician Assistant

## 2014-07-21 ENCOUNTER — Other Ambulatory Visit: Payer: Self-pay | Admitting: Physician Assistant

## 2014-07-22 NOTE — Telephone Encounter (Signed)
Medication refilled per protocol. 

## 2014-08-02 ENCOUNTER — Encounter: Payer: Self-pay | Admitting: Physician Assistant

## 2014-09-07 LAB — HM MAMMOGRAPHY: HM MAMMO: NORMAL

## 2014-09-24 ENCOUNTER — Encounter: Payer: Self-pay | Admitting: Family Medicine

## 2014-09-28 ENCOUNTER — Encounter: Payer: Self-pay | Admitting: Physician Assistant

## 2014-12-03 ENCOUNTER — Other Ambulatory Visit: Payer: Self-pay | Admitting: Physician Assistant

## 2014-12-03 NOTE — Telephone Encounter (Signed)
Refill appropriate and filled per protocol. 

## 2015-01-07 ENCOUNTER — Other Ambulatory Visit: Payer: Self-pay | Admitting: Physician Assistant

## 2015-01-10 ENCOUNTER — Encounter: Payer: Self-pay | Admitting: Family Medicine

## 2015-01-10 NOTE — Telephone Encounter (Signed)
Medication refill for one time only.  Patient needs to be seen.  Letter sent for patient to call and schedule 

## 2015-02-03 ENCOUNTER — Ambulatory Visit (INDEPENDENT_AMBULATORY_CARE_PROVIDER_SITE_OTHER): Admitting: Physician Assistant

## 2015-02-03 ENCOUNTER — Encounter: Payer: Self-pay | Admitting: Physician Assistant

## 2015-02-03 VITALS — BP 134/96 | HR 80 | Temp 98.2°F | Resp 18 | Wt 213.0 lb

## 2015-02-03 DIAGNOSIS — I10 Essential (primary) hypertension: Secondary | ICD-10-CM

## 2015-02-03 DIAGNOSIS — D649 Anemia, unspecified: Secondary | ICD-10-CM | POA: Diagnosis not present

## 2015-02-03 DIAGNOSIS — H9190 Unspecified hearing loss, unspecified ear: Secondary | ICD-10-CM | POA: Diagnosis not present

## 2015-02-03 DIAGNOSIS — E669 Obesity, unspecified: Secondary | ICD-10-CM

## 2015-02-03 MED ORDER — LISINOPRIL 20 MG PO TABS: 20.0000 mg | ORAL_TABLET | Freq: Every day | ORAL | Status: DC

## 2015-02-03 MED ORDER — HYDROCHLOROTHIAZIDE 25 MG PO TABS: 25.0000 mg | ORAL_TABLET | Freq: Every day | ORAL | Status: DC

## 2015-02-03 MED ORDER — AMLODIPINE BESYLATE 10 MG PO TABS: 10.0000 mg | ORAL_TABLET | Freq: Every day | ORAL | Status: DC

## 2015-02-03 NOTE — Progress Notes (Signed)
    Patient ID: Courtney Hickman MRN: [id number], DOB: 1960-09-18, 55 y.o. Date of Encounter: 02/03/2015, 2:52 PM    Chief Complaint:  Chief Complaint  Patient presents with  . BP check/med refills     HPI: 55 y.o. year old white female hard of hearing. Here for routine follow-up of her hypertension. I reviewed that her blood pressure reading is borderline high today. She says that she has been out of her amlodipine and HCTZ for 2 days. Otherwise has been taking all of her medicines as directed and is still taking her other blood pressure medicine lisinopril. Has been having no adverse effects with any medicines. Has no complaints or concerns today.     Home Meds:   Outpatient Prescriptions Prior to Visit  Medication Sig Dispense Refill  . amLODipine (NORVASC) 10 MG tablet TAKE ONE TABLET BY MOUTH ONCE DAILY 30 tablet 5  . Ferrous Sulfate (IRON) 325 (65 FE) MG TABS Take 1 tablet by mouth daily.    . hydrochlorothiazide (HYDRODIURIL) 25 MG tablet TAKE ONE TABLET BY MOUTH ONCE DAILY 30 tablet 5  . lisinopril (PRINIVIL,ZESTRIL) 20 MG tablet TAKE ONE TABLET BY MOUTH ONCE DAILY 30 tablet 0  . Multiple Vitamin (MULTIVITAMIN) tablet Take 1 tablet by mouth daily.     No facility-administered medications prior to visit.    Allergies:  Allergies  Allergen Reactions  . Actifed Cold-Allergy [Chlorpheniramine-Phenyleph Er] Anaphylaxis    Eyes hurt / headache      Review of Systems: See HPI for pertinent ROS. All other ROS negative.    Physical Exam: Blood pressure 134/96, pulse 80, temperature 98.2 F (36.8 C), temperature source Oral, resp. rate 18, weight 213 lb (96.616 kg)., Body mass index is 42.32 kg/(m^2). General:  Obese white female. Hard of hearing. Appears in no acute distress. Neck: Supple. No thyromegaly. No lymphadenopathy. No carotid bruits. Lungs: Clear bilaterally to auscultation without wheezes, rales, or rhonchi. Breathing is unlabored. Heart: Regular rhythm. No  murmurs, rubs, or gallops. Msk:  Strength and tone normal for age. Extremities/Skin: Warm and dry.  No LE edema. Neuro: Alert and oriented X 3. Moves all extremities spontaneously. Gait is normal. CNII-XII grossly in tact. Psych:  Responds to questions appropriately with a normal affect.     ASSESSMENT AND PLAN:  55 y.o. year old female with  1. Essential hypertension See history of present illness. Blood pressure is borderline high today but she says she ran out of her amlodipine and HCTZ and had no Karlene Lineman with her today. Reviewed the blood pressure was excellent at her visit here 05/17/14 was 124/80. Continue current medications and send refills. Will check lab to monitor. - BASIC METABOLIC PANEL WITH GFR  2. Hearing impairment, unspecified laterality  3. Anemia, unspecified anemia type She has a history of anemia but her last CBC showed no anemia and this has been stable.  4. Obesity She has been educated regarding proper diet and exercise for weight management.  She had complete physical exam 54/25/16. Preventive care updated at that time.  Routine follow-up visit for hypertension follow-up 6 months or sooner if needed.   [address] [place], [place], [facility] 02/03/2015 2:52 PM

## 2015-02-04 LAB — BASIC METABOLIC PANEL WITH GFR
BUN: 13 mg/dL (ref 7–25)
CALCIUM: 10.6 mg/dL — AB (ref 8.6–10.4)
CHLORIDE: 106 mmol/L (ref 98–110)
CO2: 27 mmol/L (ref 20–31)
CREATININE: 0.9 mg/dL (ref 0.50–1.05)
GFR, Est African American: 84 mL/min (ref >=60)
GFR, Est Non African American: 73 mL/min (ref >=60)
Glucose, Bld: 70 mg/dL (ref 70–99)
Potassium: 4.2 mmol/L (ref 3.5–5.3)
Sodium: 143 mmol/L (ref 135–146)

## 2015-02-07 ENCOUNTER — Encounter: Payer: Self-pay | Admitting: Family Medicine

## 2015-07-27 ENCOUNTER — Other Ambulatory Visit: Payer: Self-pay | Admitting: Physician Assistant

## 2015-07-27 NOTE — Telephone Encounter (Signed)
Refill appropriate and filled per protocol. 

## 2015-08-11 ENCOUNTER — Encounter: Payer: Self-pay | Admitting: Physician Assistant

## 2015-08-11 ENCOUNTER — Ambulatory Visit (INDEPENDENT_AMBULATORY_CARE_PROVIDER_SITE_OTHER): Admitting: Physician Assistant

## 2015-08-11 VITALS — BP 118/82 | Temp 98.4°F | Wt 219.0 lb

## 2015-08-11 DIAGNOSIS — R221 Localized swelling, mass and lump, neck: Secondary | ICD-10-CM

## 2015-08-11 DIAGNOSIS — I10 Essential (primary) hypertension: Secondary | ICD-10-CM

## 2015-08-11 DIAGNOSIS — M7989 Other specified soft tissue disorders: Secondary | ICD-10-CM

## 2015-08-11 LAB — BASIC METABOLIC PANEL WITH GFR
BUN: 12 mg/dL (ref 7–25)
CALCIUM: 10.8 mg/dL — AB (ref 8.6–10.4)
CO2: 21 mmol/L (ref 20–31)
Chloride: 102 mmol/L (ref 98–110)
Creat: 1.02 mg/dL (ref 0.50–1.05)
GFR, EST AFRICAN AMERICAN: 73 mL/min (ref >=60)
GFR, EST NON AFRICAN AMERICAN: 63 mL/min (ref >=60)
GLUCOSE: 77 mg/dL (ref 70–99)
POTASSIUM: 4.3 mmol/L (ref 3.5–5.3)
Sodium: 138 mmol/L (ref 135–146)

## 2015-08-11 MED ORDER — LISINOPRIL 20 MG PO TABS: 20.0000 mg | ORAL_TABLET | Freq: Every day | ORAL | Status: DC

## 2015-08-11 NOTE — Progress Notes (Addendum)
Patient ID: DAVEN HOCHMAN MRN: [id number], DOB: 1960-06-14, 55 y.o. Date of Encounter: 08/11/2015, 10:35 AM    Chief Complaint:  Chief Complaint  Patient presents with  . Follow-up     HPI: 55 y.o. year old white female hard of hearing. Here for routine follow-up of her hypertension. Has been having no adverse effects with any medicines.   She says that she has 3 children ages 71, 62, and 40. They all live at home. She says that the oldest 54 year old is her daughter and had a brain tumor that had to be resected. That has left her with some residual deficits so that is part of the reason she still living at home. She says that the 54 year old and 54 year old her boys. Says that  "they are trying to find jobs, which is difficult to do in [facility]."  At end of visit, she also reports that she has recently noticed her left arm being larger than her right arm. "Wasn't sure if it is just part of getting older/natural aging process." Points to region between biceps and inner elbow region and says that that area felt a little "tight / sore" at one point. Otherwise has not noticed any pain or other symptoms --  just noticed that the left arm is larger than the right.  No other complaints or concerns.   Home Meds:   Outpatient Prescriptions Prior to Visit  Medication Sig Dispense Refill  . amLODipine (NORVASC) 10 MG tablet TAKE ONE TABLET BY MOUTH ONCE DAILY 90 tablet 0  . Ferrous Sulfate (IRON) 325 (65 FE) MG TABS Take 1 tablet by mouth daily.    . hydrochlorothiazide (HYDRODIURIL) 25 MG tablet TAKE ONE TABLET BY MOUTH ONCE DAILY 90 tablet 0  . ibuprofen (ADVIL,MOTRIN) 200 MG tablet Take 200 mg by mouth every 6 (six) hours as needed.    . Multiple Vitamin (MULTIVITAMIN) tablet Take 1 tablet by mouth daily.    Marland Kitchen lisinopril (PRINIVIL,ZESTRIL) 20 MG tablet Take 1 tablet (20 mg total) by mouth daily. 90 tablet 1   No facility-administered medications prior to visit.    Allergies:   Allergies  Allergen Reactions  . Actifed Cold-Allergy [Chlorpheniramine-Phenyleph Er] Anaphylaxis    Eyes hurt / headache      Review of Systems: See HPI for pertinent ROS. All other ROS negative.    Physical Exam: Blood pressure 118/82, temperature 98.4 F (36.9 C), weight 219 lb (99.338 kg)., Body mass index is 43.51 kg/(m^2). General:  Obese white female. Hard of hearing. Appears in no acute distress. Neck: Supple. No thyromegaly.  No carotid bruits. Left Arm is larger than Right  -- even at the wrist level---left wrist appears larger circumferance than right.   When I stood and examined her while she was standing--- the top of her left neck/shoulder is higher than the top of her right neck/shoulder.  She has increased "puffiness" left anterior neck compared to same area on right anterior neck. This area is soft with palpation.  Lungs: Clear bilaterally to auscultation without wheezes, rales, or rhonchi. Breathing is unlabored. Heart: Regular rhythm. No murmurs, rubs, or gallops. Msk:  Strength and tone normal for age. Extremities/Skin: Warm and dry.  No LE edema. Neuro: Alert and oriented X 3. Moves all extremities spontaneously. Gait is normal. CNII-XII grossly in tact. Psych:  Responds to questions appropriately with a normal affect.     ASSESSMENT AND PLAN:  55 y.o. year old female with    Left arm  swelling - VAS [place] UPPER EXTREMITY VENOUS DUPLEX; Future---Marked as STAT - US Soft Tissue Head/Neck; Future-----Marked as STAT  Mass of left side of neck ------------------Exam concerning for Cervical Lymphadenopathy----------------- - VAS [place] UPPER EXTREMITY VENOUS DUPLEX; Future - US Soft Tissue Head/Neck; Future  Essential hypertension Blood pressure is at goal/well controlled. Continue current medications. Check labs to monitor. - BASIC METABOLIC PANEL WITH GFR  Hearing impairment, unspecified laterality  Anemia, unspecified anemia type She has a history of anemia  but her last CBC showed no anemia and this has been stable.  Obesity She has been educated regarding proper diet and exercise for weight management.   She had complete physical exam 05/17/14. Preventive care updated at that time.  Will obtain venous Doppler left upper extremity and also obtain ultrasound of left neck. --- Marked these orders as STAT Will follow-up with her when I get these results.  As well, I will have her go ahead and schedule follow-up visit here with me in 2 weeks to make sure that this is followed.  She voices understanding and agrees.    Signed, [address] [place], [place], [facility] 08/11/2015 10:35 AM

## 2015-08-15 ENCOUNTER — Other Ambulatory Visit: Payer: Self-pay | Admitting: Family Medicine

## 2015-08-15 ENCOUNTER — Ambulatory Visit (HOSPITAL_COMMUNITY)
Admission: RE | Admit: 2015-08-15 | Discharge: 2015-08-15 | Disposition: A | Discharge status: Home / Self Care | Source: Ambulatory Visit | Attending: Physician Assistant | Admitting: Physician Assistant

## 2015-08-15 DIAGNOSIS — R221 Localized swelling, mass and lump, neck: Secondary | ICD-10-CM | POA: Diagnosis present

## 2015-08-15 DIAGNOSIS — M7989 Other specified soft tissue disorders: Secondary | ICD-10-CM

## 2015-08-16 ENCOUNTER — Other Ambulatory Visit: Payer: Self-pay | Admitting: Family Medicine

## 2015-08-16 DIAGNOSIS — M7989 Other specified soft tissue disorders: Secondary | ICD-10-CM

## 2015-08-16 DIAGNOSIS — R221 Localized swelling, mass and lump, neck: Secondary | ICD-10-CM

## 2015-08-23 ENCOUNTER — Ambulatory Visit (HOSPITAL_COMMUNITY)
Admission: RE | Admit: 2015-08-23 | Discharge: 2015-08-23 | Disposition: A | Discharge status: Home / Self Care | Source: Ambulatory Visit | Attending: Physician Assistant | Admitting: Physician Assistant

## 2015-08-23 DIAGNOSIS — R221 Localized swelling, mass and lump, neck: Secondary | ICD-10-CM | POA: Insufficient documentation

## 2015-08-23 DIAGNOSIS — M7989 Other specified soft tissue disorders: Secondary | ICD-10-CM | POA: Insufficient documentation

## 2015-08-23 MED ORDER — IOPAMIDOL (ISOVUE-300) INJECTION 61%
75.0000 mL | Freq: Once | INTRAVENOUS | Status: AC | PRN
  Administered 2015-08-23: 75 mL via INTRAVENOUS

## 2015-09-07 ENCOUNTER — Encounter: Payer: Self-pay | Admitting: Physician Assistant

## 2015-09-07 ENCOUNTER — Ambulatory Visit (INDEPENDENT_AMBULATORY_CARE_PROVIDER_SITE_OTHER): Admitting: Physician Assistant

## 2015-09-07 VITALS — BP 110/66 | HR 80 | Temp 98.6°F | Resp 16 | Ht 59.5 in | Wt 216.0 lb

## 2015-09-07 DIAGNOSIS — R29898 Other symptoms and signs involving the musculoskeletal system: Secondary | ICD-10-CM | POA: Diagnosis not present

## 2015-09-07 DIAGNOSIS — N390 Urinary tract infection, site not specified: Secondary | ICD-10-CM

## 2015-09-07 DIAGNOSIS — T7840XA Allergy, unspecified, initial encounter: Secondary | ICD-10-CM

## 2015-09-07 MED ORDER — CIPROFLOXACIN HCL 500 MG PO TABS: 500.0000 mg | ORAL_TABLET | Freq: Two times a day (BID) | ORAL | 0 refills | Status: DC

## 2015-09-07 MED ORDER — PREDNISONE 20 MG PO TABS: ORAL_TABLET | ORAL | 0 refills | Status: DC

## 2015-09-07 NOTE — Progress Notes (Signed)
Patient ID: Courtney Hickman MRN: [id number], DOB: [birth year], 55 y.o. Date of Encounter: @DATE @  Chief Complaint:  Chief Complaint  Patient presents with  . Follow-up    reports no change     HPI: 55 y.o. year old white femalehite female  presents with above.   She is here to follow up regarding discrepancy in arm size.  At last office visit she mentioned that she had recently noticed her left arm seeming larger than the right. I then obtained the following evaluations:  Venous Doppler left upper extremity 08/15/15--- no evidence of left upper extremity DVT Ultrasound of head and neck was normal. Specifically no soft tissue lesion. Unremarkable lymph nodes.  I then called patient with those results and spoke with her further.  She reported that she had had no mastectomy no surgery to the chest etc.-- Then planned to obtain CT chest.  CT chest performed 08/23/2015--- no acute process within the chest. I reviewed the entire report and it is all negative. Also showed no axillary mediastinal or hilar lymphadenopathy.  Also mammogram August 2016 negative.  She reports that she also wanted to follow-up on another issue while she is here. Says that she saw her gynecologist last Friday because she wasn't sure if she had a GYN infection or UTI and also she been having problems with heavy menses so therefore she had visit with her gynecologist last Friday. Says that at that visit a diagnosed with UTI and prescribed Bactrim. Says that she took 1 dose of Bactrim on Friday and on Saturday morning woke up with rash. However she went ahead and took both of her doses of Bactrim on Saturday. She called her gynecologist on Monday. He told her at that time they felt that she had gotten enough Bactrim that the UTI should be treated. Told her that in regards to the rash to just simply stop the Bactrim. Patient states that she feels that she is still having some symptoms of UTI and feels that it was "90% treated but not  completely "  Also, says that the rash has not resolved.  Past Medical History:  Diagnosis Date  . Anemia 08/23/2011   Dr. Benson Norway: EGD, Colonoscopy, Capsule Endo--all negative  . Anemia    Had metrorhagia at that time  . Hearing impairment   . Hypertension      Home Meds: Outpatient Medications Prior to Visit  Medication Sig Dispense Refill  . amLODipine (NORVASC) 10 MG tablet TAKE ONE TABLET BY MOUTH ONCE DAILY 90 tablet 0  . Ferrous Sulfate (IRON) 325 (65 FE) MG TABS Take 1 tablet by mouth daily.    . hydrochlorothiazide (HYDRODIURIL) 25 MG tablet TAKE ONE TABLET BY MOUTH ONCE DAILY 90 tablet 0  . ibuprofen (ADVIL,MOTRIN) 200 MG tablet Take 200 mg by mouth every 6 (six) hours as needed.    Marland Kitchen lisinopril (PRINIVIL,ZESTRIL) 20 MG tablet Take 1 tablet (20 mg total) by mouth daily. 90 tablet 1  . Multiple Vitamin (MULTIVITAMIN) tablet Take 1 tablet by mouth daily.     No facility-administered medications prior to visit.     Allergies:  Allergies  Allergen Reactions  . Actifed Cold-Allergy [Chlorpheniramine-Phenyleph Er] Anaphylaxis    Eyes hurt / headache  . Bactrim [Sulfamethoxazole-Trimethoprim] Rash    Social History   Social History  . Marital status: Married    Spouse name: N/A  . Number of children: N/A  . Years of education: N/A   Occupational History  . Not on  file.   Social History Main Topics  . Smoking status: Never Smoker  . Smokeless tobacco: Never Used  . Alcohol use No  . Drug use: No  . Sexual activity: Not on file   Other Topics Concern  . Not on file   Social History Narrative   Married. HomeMaker.   3 children; 48, 66, 71 y/o    No family history on file.   Review of Systems:  See HPI for pertinent ROS. All other ROS negative.    Physical Exam: Blood pressure 110/66, pulse 80, temperature 98.6 F (37 C), temperature source Oral, resp. rate 16, height 4' 11.5" (1.511 m), weight 216 lb (98 kg), SpO2 97 %., Body mass index is 42.9  kg/m. General: Obese WF. Appears in no acute distress. Neck: Supple. No thyromegaly. No lymphadenopathy. Lungs: Clear bilaterally to auscultation without wheezes, rales, or rhonchi. Breathing is unlabored. Heart: RRR with S1 S2. No murmurs, rubs, or gallops. Musculoskeletal:  Strength and tone normal for age. I measured the following areas of her arms bilaterally: Left distal wrist--- at level of wrist joint----19.5 cm Right distal wrist--- at level of wrist joint--- 19.0 cm Just distal to the elbow joint--left--- 33.0 cm Just distal to the elbow joint-----right------ 30.4 cm Proximal upper arm ---at the axilla and around the arm--( but not up to the shoulder)-- but out around the arm----left 45 cm ------------------------- right 45.7 cm Midway of the upper arm wrapped around the bicep tricep region midway between the shoulder and elbow-- ------------------ left----41 cm ------------------ right--- 44 cm  Extremities/Skin:  She has macular rash with erythematous splotches diffuse over her chest and abdomen and splotches of this on her anterior shins and her arms. Neuro: Alert and oriented X 3. Moves all extremities spontaneously. Gait is normal. CNII-XII grossly in tact. Psych:  Responds to questions appropriately with a normal affect.     ASSESSMENT AND PLAN:  55 y.o. year old female with  1. Arm size discrepancy She has had all of the studies documented above. At this time we are going to monitor this. She will follow-up if she notices this discrepancy increasing.  2. Urinary tract infection, site not specified Will have her take Cipro 5 days to completely treat UTI - ciprofloxacin (CIPRO) 500 MG tablet; Take 1 tablet (500 mg total) by mouth 2 (two) times daily.  Dispense: 10 tablet; Refill: 0  3. Allergic reaction, initial encounter Will have her take prednisone taper and also she is to take Benadryl as directed. - predniSONE (DELTASONE) 20 MG tablet; Take 3 daily for 2 days,  then 2 daily for 2 days, then 1 daily for 2 days.  Dispense: 12 tablet; Refill: 0   Signed, [address] [place], [place], [facility] 09/07/2015 1:38 PM

## 2015-10-28 ENCOUNTER — Other Ambulatory Visit: Payer: Self-pay | Admitting: Physician Assistant

## 2016-02-07 ENCOUNTER — Other Ambulatory Visit: Payer: Self-pay | Admitting: Physician Assistant

## 2016-02-10 NOTE — Telephone Encounter (Signed)
RX fIlled.PT need f/u Ov, letter mailed

## 2016-02-16 ENCOUNTER — Other Ambulatory Visit: Payer: Self-pay | Admitting: Physician Assistant

## 2016-02-16 NOTE — Telephone Encounter (Signed)
Rx filled per protocol  

## 2016-05-03 ENCOUNTER — Ambulatory Visit (INDEPENDENT_AMBULATORY_CARE_PROVIDER_SITE_OTHER): Admitting: Physician Assistant

## 2016-05-03 ENCOUNTER — Encounter: Payer: Self-pay | Admitting: Physician Assistant

## 2016-05-03 VITALS — BP 130/86 | HR 89 | Temp 98.1°F | Resp 16 | Wt 223.6 lb

## 2016-05-03 DIAGNOSIS — I10 Essential (primary) hypertension: Secondary | ICD-10-CM

## 2016-05-03 LAB — BASIC METABOLIC PANEL WITH GFR
BUN: 13 mg/dL (ref 7–25)
CHLORIDE: 103 mmol/L (ref 98–110)
CO2: 24 mmol/L (ref 20–31)
Calcium: 11.1 mg/dL — ABNORMAL HIGH (ref 8.6–10.4)
Creat: 1 mg/dL (ref 0.50–1.05)
GFR, EST NON AFRICAN AMERICAN: 64 mL/min (ref >=60)
GFR, Est African American: 74 mL/min (ref >=60)
Glucose, Bld: 88 mg/dL (ref 70–99)
Potassium: 3.6 mmol/L (ref 3.5–5.3)
SODIUM: 140 mmol/L (ref 135–146)

## 2016-05-03 NOTE — Progress Notes (Signed)
Patient ID: Courtney Hickman MRN: [id number], DOB: 24-Mar-1960, 56 y.o. Date of Encounter: 05/03/2016, 10:46 AM    Chief Complaint:  Chief Complaint  Patient presents with  . medication refills     HPI: 56 y.o. year old white female here for routine OV to f/u HTN.  She is hard of hearing. Here for routine follow-up of her hypertension. Has been having no adverse effects with any medicines.   She says that she has 3 children ages 58, 44, and 60. They all live at home. She says that the oldest 55 year old is her daughter and had a brain tumor that had to be resected. That has left her with some residual deficits so that is part of the reason she still living at home. She says that the 55 year old and 55 year old her boys. Says that  "they are trying to find jobs, which is difficult to do in [facility]."    Home Meds:   Outpatient Medications Prior to Visit  Medication Sig Dispense Refill  . amLODipine (NORVASC) 10 MG tablet TAKE ONE TABLET BY MOUTH ONCE DAILY 90 tablet 0  . Ferrous Sulfate (IRON) 325 (65 FE) MG TABS Take 1 tablet by mouth daily.    . hydrochlorothiazide (HYDRODIURIL) 25 MG tablet TAKE ONE TABLET BY MOUTH ONCE DAILY 90 tablet 0  . ibuprofen (ADVIL,MOTRIN) 200 MG tablet Take 200 mg by mouth every 6 (six) hours as needed.    Marland Kitchen lisinopril (PRINIVIL,ZESTRIL) 20 MG tablet TAKE ONE TABLET BY MOUTH ONCE DAILY 90 tablet 1  . Multiple Vitamin (MULTIVITAMIN) tablet Take 1 tablet by mouth daily.    . ciprofloxacin (CIPRO) 500 MG tablet Take 1 tablet (500 mg total) by mouth 2 (two) times daily. 10 tablet 0  . predniSONE (DELTASONE) 20 MG tablet Take 3 daily for 2 days, then 2 daily for 2 days, then 1 daily for 2 days. 12 tablet 0   No facility-administered medications prior to visit.     Allergies:  Allergies  Allergen Reactions  . Actifed Cold-Allergy [Chlorpheniramine-Phenyleph Er] Anaphylaxis    Eyes hurt / headache  . Bactrim [Sulfamethoxazole-Trimethoprim]  Rash      Review of Systems: See HPI for pertinent ROS. All other ROS negative.    Physical Exam: Blood pressure 130/86, pulse 89, temperature 98.1 F (36.7 C), temperature source Oral, resp. rate 16, weight 223 lb 9.6 oz (101.4 kg), last menstrual period 04/02/2016, SpO2 97 %., Body mass index is 44.41 kg/m. General:  Obese white female. Hard of hearing. Appears in no acute distress. Neck: Supple. No thyromegaly.  No carotid bruits. Lungs: Clear bilaterally to auscultation without wheezes, rales, or rhonchi. Breathing is unlabored. Heart: Regular rhythm. No murmurs, rubs, or gallops. Msk:  Strength and tone normal for age. Extremities/Skin: Warm and dry.  No LE edema. Neuro: Alert and oriented X 3. Moves all extremities spontaneously. Gait is normal. CNII-XII grossly in tact. Psych:  Responds to questions appropriately with a normal affect.     ASSESSMENT AND PLAN:  56 y.o. year old female with   Essential hypertension Blood pressure is at goal/well controlled. Continue current medications. Check labs to monitor. - BASIC METABOLIC PANEL WITH GFR  Hearing impairment, unspecified laterality  Anemia, unspecified anemia type She has a history of anemia but her last CBC showed no anemia and this has been stable.  Obesity She has been educated regarding proper diet and exercise for weight management.   She had complete physical exam 05/17/14. Preventive care updated  at that time.  Routine follow-up visit in 6 months. At that visit will discuss scheduling another physical.  Signed, Olean Ree [place], [place], [facility] 05/03/2016 10:46 AM

## 2016-05-04 ENCOUNTER — Encounter: Payer: Self-pay | Admitting: Family Medicine

## 2016-05-25 ENCOUNTER — Other Ambulatory Visit: Payer: Self-pay | Admitting: Physician Assistant

## 2016-08-30 ENCOUNTER — Encounter: Payer: Self-pay | Admitting: Physician Assistant

## 2016-09-21 ENCOUNTER — Other Ambulatory Visit: Payer: Self-pay | Admitting: Physician Assistant

## 2016-09-25 IMAGING — US US SOFT TISSUE HEAD/NECK
1 series · 14 of 25 positions shown · non-contrast
Comparison: None.

CLINICAL DATA: 54-year-old female with a history of anterior left
neck swelling. Report from the patient is that this area was
palpated by the physician, and she does not feel a problem

EXAM:
ULTRASOUND OF HEAD/NECK SOFT TISSUES
TECHNIQUE: Ultrasound examination of the head and neck soft tissues was
performed in the area of clinical concern.

[Series 1: us soft tissue head/neck · 0.06mm/px · 14 of 28 slices shown]
[im 1/28]
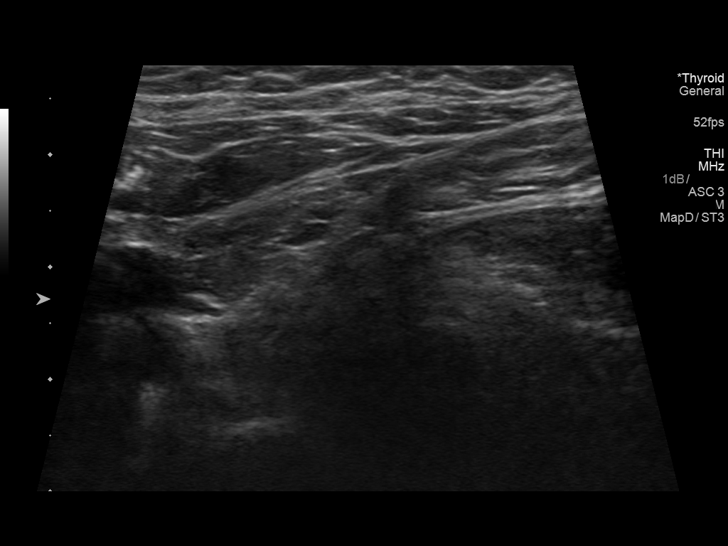
[im 3/28]
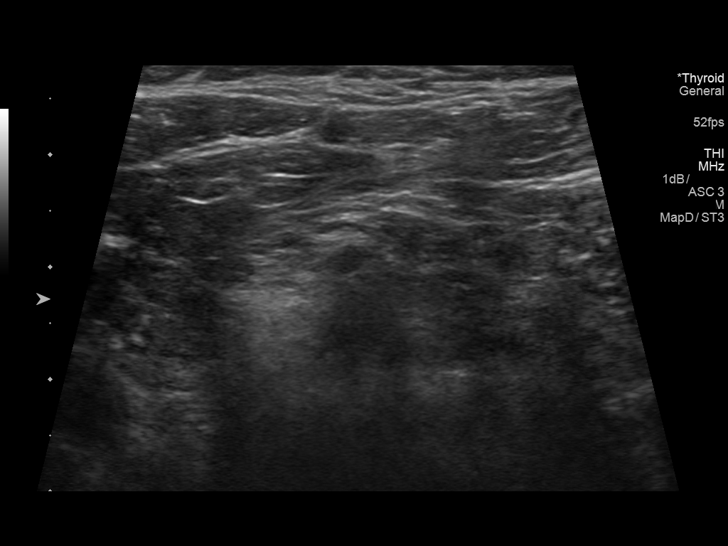
[im 5/28]
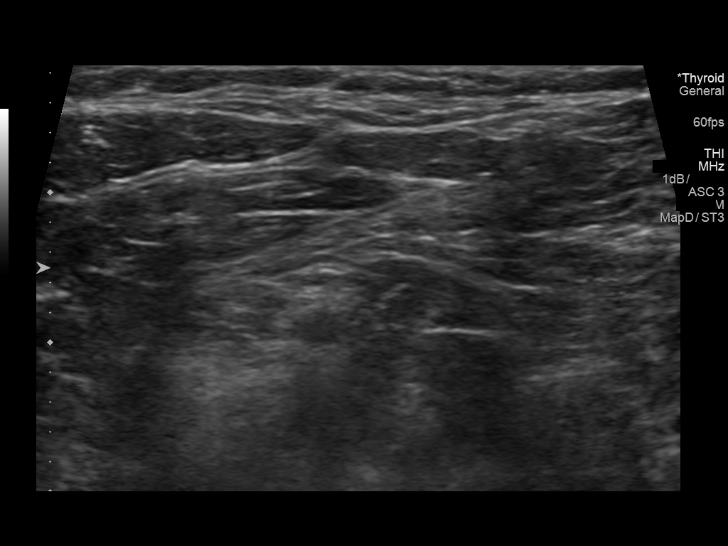
[im 7/28]
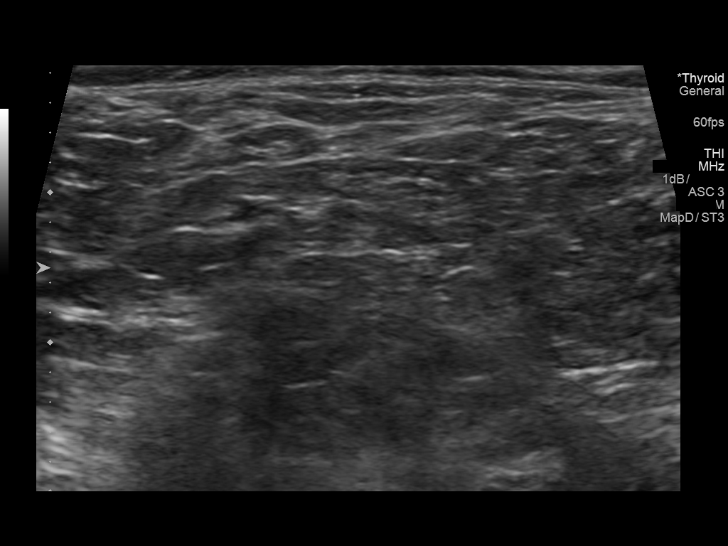
[im 10/28]
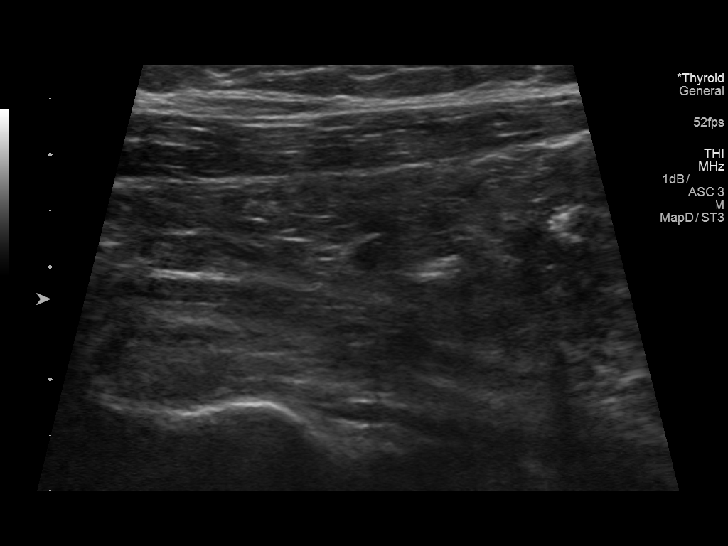
[im 11/28]
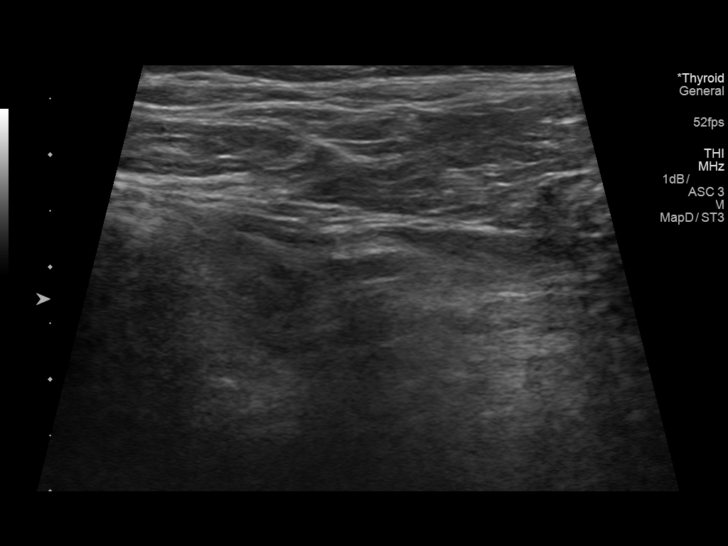
[im 13/28]
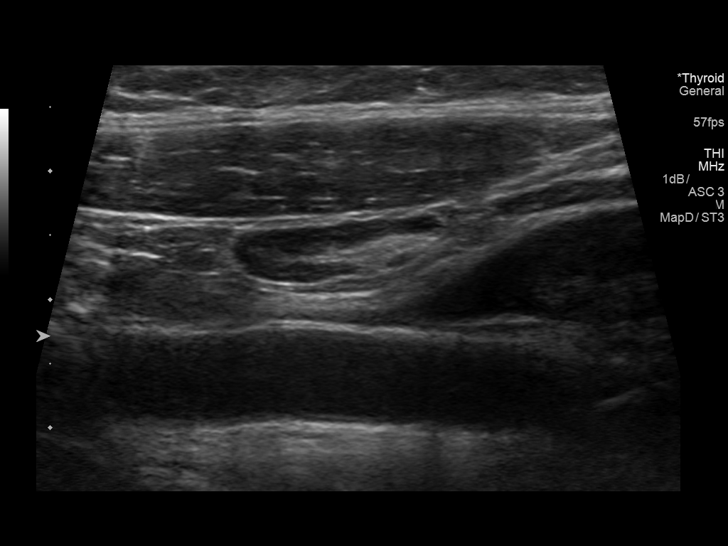
[im 15/28]
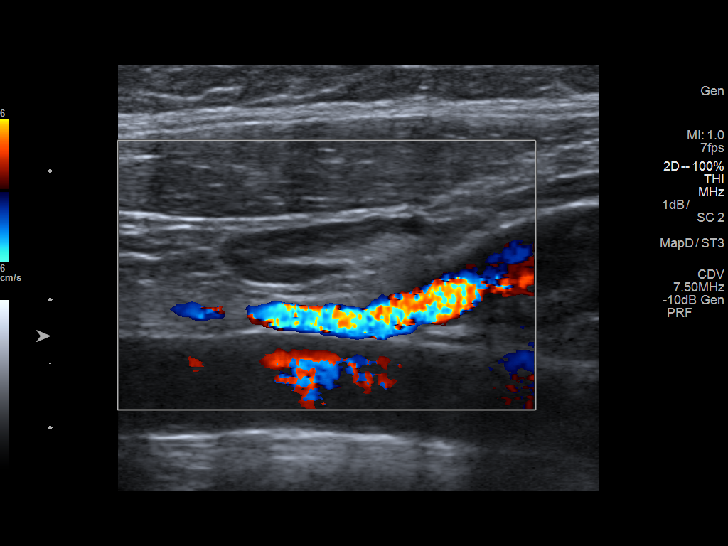
[im 17/28]
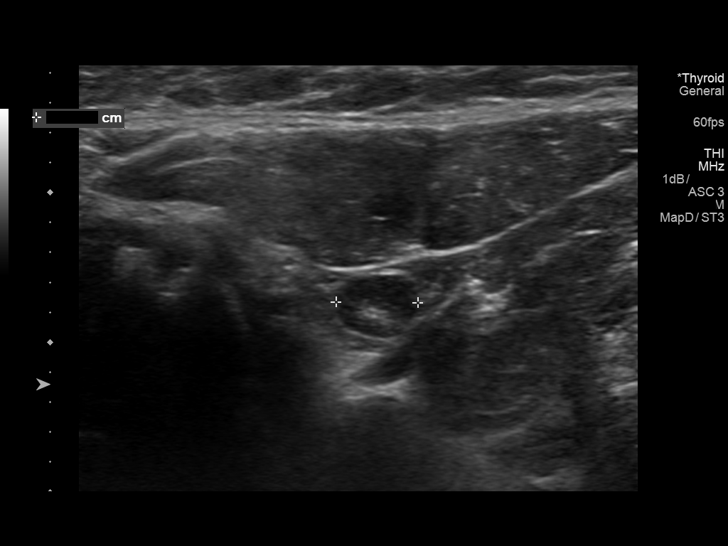
[im 19/28]
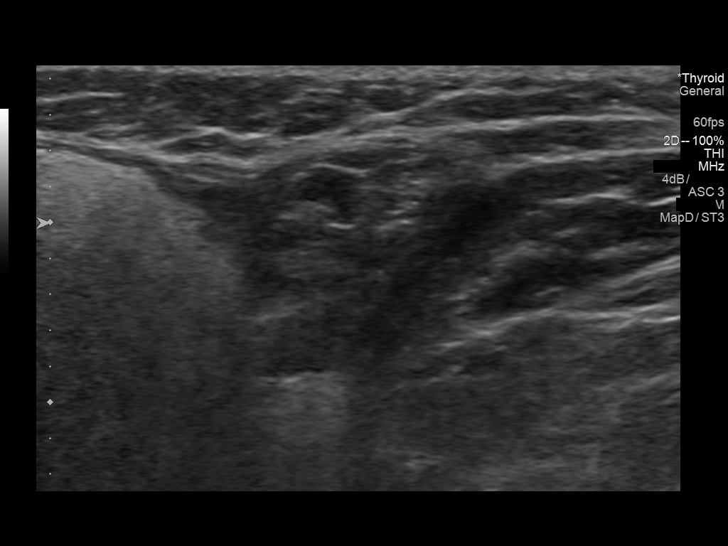
[im 21/28]
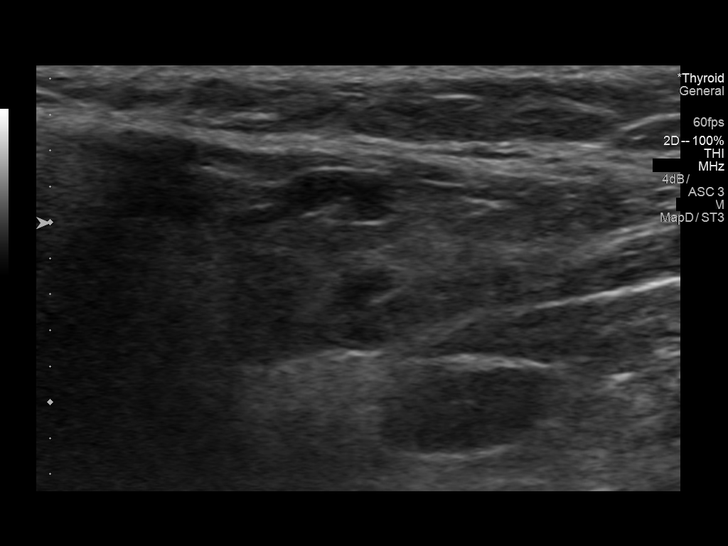
[im 23/28]
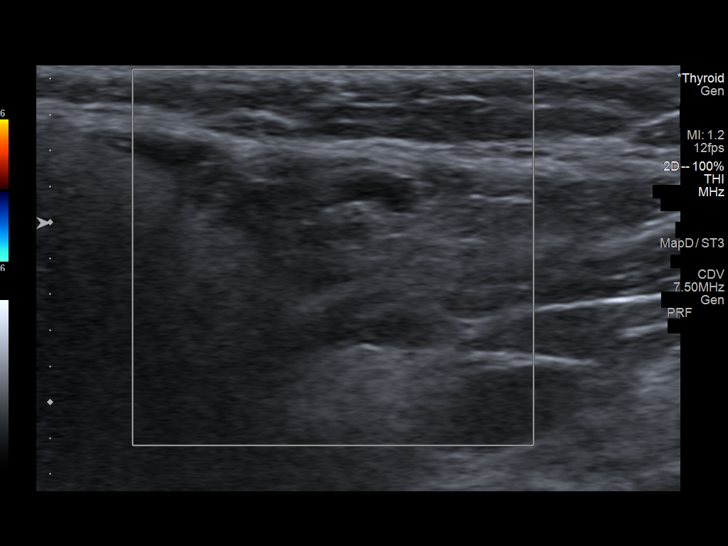
[im 25/28]
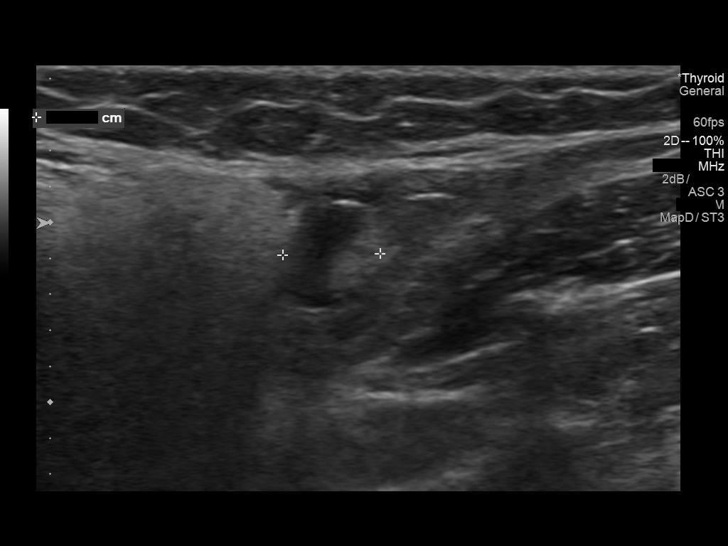
[im 28/28]
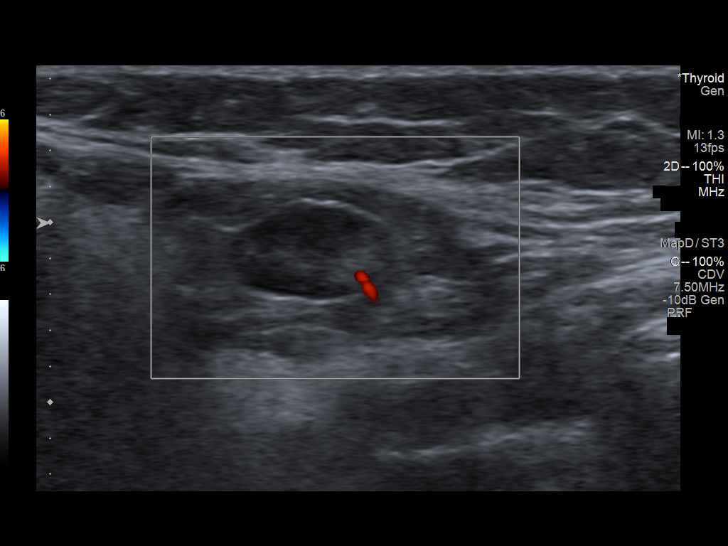

[14 of 25 positions shown; findings below may reference images not displayed]

FINDINGS: Grayscale and color duplex performed in the region of clinical
concern.

No focal fluid collection. No soft tissue lesion. Unremarkable lymph
nodes in the region of the left neck surveyed.
IMPRESSION: Unremarkable sonographic survey in the region of clinical concern.

## 2016-09-27 ENCOUNTER — Encounter: Payer: Self-pay | Admitting: Physician Assistant

## 2016-11-23 ENCOUNTER — Encounter: Payer: Self-pay | Admitting: Family Medicine

## 2016-11-23 ENCOUNTER — Ambulatory Visit (INDEPENDENT_AMBULATORY_CARE_PROVIDER_SITE_OTHER): Admitting: Family Medicine

## 2016-11-23 VITALS — BP 118/78 | HR 94 | Temp 97.8°F | Resp 16 | Ht 59.5 in | Wt 227.0 lb

## 2016-11-23 DIAGNOSIS — H66001 Acute suppurative otitis media without spontaneous rupture of ear drum, right ear: Secondary | ICD-10-CM

## 2016-11-23 DIAGNOSIS — H60501 Unspecified acute noninfective otitis externa, right ear: Secondary | ICD-10-CM

## 2016-11-23 MED ORDER — AMOXICILLIN 875 MG PO TABS: 875.0000 mg | ORAL_TABLET | Freq: Two times a day (BID) | ORAL | 0 refills | Status: DC

## 2016-11-23 MED ORDER — NEOMYCIN-POLYMYXIN-HC 3.5-10000-1 OT SOLN: 3.0000 [drp] | Freq: Four times a day (QID) | OTIC | 0 refills | Status: DC

## 2016-11-23 NOTE — Progress Notes (Signed)
    Subjective:    Patient ID: Courtney Hickman, female    DOB: 15-Feb-1960, 56 y.o.   MRN: [id number]  Patient presents for R Ear Pain (x3 days- swellingin outer ear canal, pain in lower ear canal- L ear started this AM)  Pain and swelling right ear for past 3 days. DIfficukty getting hearing aide in,. Has mild cough unchanged for years, no congesiton, no fever.  + son has a cold  Has not used any drops for ear, has taken ibuprofen for pain, used peroxide to help rinse ear as well      Review Of Systems:  GEN- denies fatigue, fever, weight loss,weakness, recent illness HEENT- denies eye drainage, change in vision, nasal discharge, CVS- denies chest pain, palpitations RESP- denies SOB, cough, wheeze ABD- denies N/V, change in stools, abd pain GU- denies dysuria, hematuria, dribbling, incontinence MSK- denies joint pain, muscle aches, injury Neuro- denies headache, dizziness, syncope, seizure activity       Objective:    BP 118/78   Pulse 94   Temp 97.8 F (36.6 C) (Oral)   Resp 16   Ht 4' 11.5" (1.511 m)   Wt 227 lb (103 kg)   SpO2 96%   BMI 45.08 kg/m  GEN- NAD, alert and oriented x3 HEENT- PERRL, EOMI, non injected sclera, pink conjunctiva, MMM, oropharynx clear, nares clear, TM clear left, canal clear, Right canal erythema with swelling, bulging TM with erythema effusion  Neck- Supple, no thyromegaly CVS- RRR, no murmur RESP-CTAB         Assessment & Plan:      Problem List Items Addressed This Visit    None    Visit Diagnoses    Acute suppurative otitis media of right ear without spontaneous rupture of tympanic membrane, recurrence not specified    -  Primary   OM with OE, given polytrim drops and amoxicllin, wears hearing aide, may have had water trapped   Relevant Medications   amoxicillin (AMOXIL) 875 MG tablet   Acute otitis externa of right ear, unspecified type          Note: This dictation was prepared with Dragon dictation along with smaller  phrase technology. Any transcriptional errors that result from this process are unintentional.

## 2016-11-23 NOTE — Patient Instructions (Signed)
F/u AS NEEDED  

## 2017-01-23 ENCOUNTER — Other Ambulatory Visit: Payer: Self-pay | Admitting: Physician Assistant

## 2017-05-03 ENCOUNTER — Other Ambulatory Visit: Payer: Self-pay | Admitting: Physician Assistant

## 2017-05-20 ENCOUNTER — Ambulatory Visit (INDEPENDENT_AMBULATORY_CARE_PROVIDER_SITE_OTHER): Admitting: Physician Assistant

## 2017-05-20 ENCOUNTER — Encounter: Payer: Self-pay | Admitting: Physician Assistant

## 2017-05-20 ENCOUNTER — Other Ambulatory Visit: Payer: Self-pay

## 2017-05-20 VITALS — BP 108/80 | HR 101 | Temp 97.7°F | Resp 16 | Ht 62.0 in | Wt 227.4 lb

## 2017-05-20 DIAGNOSIS — K649 Unspecified hemorrhoids: Secondary | ICD-10-CM | POA: Diagnosis not present

## 2017-05-20 DIAGNOSIS — B372 Candidiasis of skin and nail: Secondary | ICD-10-CM

## 2017-05-20 DIAGNOSIS — Z0000 Encounter for general adult medical examination without abnormal findings: Secondary | ICD-10-CM | POA: Diagnosis not present

## 2017-05-20 DIAGNOSIS — I10 Essential (primary) hypertension: Secondary | ICD-10-CM | POA: Diagnosis not present

## 2017-05-20 LAB — CBC WITH DIFFERENTIAL/PLATELET
BASOS PCT: 1.1 %
Basophils Absolute: 67 cells/uL (ref 0–200)
Eosinophils Absolute: 201 cells/uL (ref 15–500)
Eosinophils Relative: 3.3 %
HCT: 48.2 % — ABNORMAL HIGH (ref 35.0–45.0)
Hemoglobin: 17.2 g/dL — ABNORMAL HIGH (ref 11.7–15.5)
Lymphs Abs: 1854 cells/uL (ref 850–3900)
MCH: 31.6 pg (ref 27.0–33.0)
MCHC: 35.7 g/dL (ref 32.0–36.0)
MCV: 88.4 fL (ref 80.0–100.0)
MPV: 11.7 fL (ref 7.5–12.5)
Monocytes Relative: 8.3 %
Neutro Abs: 3471 cells/uL (ref 1500–7800)
Neutrophils Relative %: 56.9 %
PLATELETS: 272 10*3/uL (ref 140–400)
RBC: 5.45 10*6/uL — AB (ref 3.80–5.10)
RDW: 14.8 % (ref 11.0–15.0)
TOTAL LYMPHOCYTE: 30.4 %
WBC: 6.1 10*3/uL (ref 3.8–10.8)
WBCMIX: 506 {cells}/uL (ref 200–950)

## 2017-05-20 LAB — COMPLETE METABOLIC PANEL WITH GFR
AG RATIO: 1.8 (calc) (ref 1.0–2.5)
ALT: 62 U/L — AB (ref 6–29)
AST: 30 U/L (ref 10–35)
Albumin: 5 g/dL (ref 3.6–5.1)
Alkaline phosphatase (APISO): 60 U/L (ref 33–130)
BILIRUBIN TOTAL: 0.6 mg/dL (ref 0.2–1.2)
BUN/Creatinine Ratio: 25 (calc) — ABNORMAL HIGH (ref 6–22)
BUN: 28 mg/dL — AB (ref 7–25)
CALCIUM: 11.5 mg/dL — AB (ref 8.6–10.4)
CHLORIDE: 103 mmol/L (ref 98–110)
CO2: 25 mmol/L (ref 20–32)
Creat: 1.1 mg/dL — ABNORMAL HIGH (ref 0.50–1.05)
GFR, Est African American: 65 mL/min/{1.73_m2} (ref >=60)
GFR, Est Non African American: 56 mL/min/{1.73_m2} — ABNORMAL LOW (ref >=60)
GLUCOSE: 91 mg/dL (ref 65–99)
Globulin: 2.8 g/dL (calc) (ref 1.9–3.7)
POTASSIUM: 3.6 mmol/L (ref 3.5–5.3)
Sodium: 141 mmol/L (ref 135–146)
TOTAL PROTEIN: 7.8 g/dL (ref 6.1–8.1)

## 2017-05-20 LAB — LIPID PANEL
Cholesterol: 153 mg/dL (ref <200)
HDL: 36 mg/dL — AB (ref >50)
LDL Cholesterol (Calc): 94 mg/dL (calc)
Non-HDL Cholesterol (Calc): 117 mg/dL (calc) (ref <130)
TRIGLYCERIDES: 134 mg/dL (ref <150)
Total CHOL/HDL Ratio: 4.3 (calc) (ref <5.0)

## 2017-05-20 LAB — TSH: TSH: 0.79 mIU/L

## 2017-05-20 MED ORDER — LOSARTAN POTASSIUM 50 MG PO TABS: 50.0000 mg | ORAL_TABLET | Freq: Every day | ORAL | 0 refills | Status: DC

## 2017-05-20 MED ORDER — HYDROCORTISONE ACETATE 25 MG RE SUPP: 25.0000 mg | Freq: Two times a day (BID) | RECTAL | 1 refills | Status: DC

## 2017-05-20 MED ORDER — NYSTATIN 100000 UNIT/GM EX CREA: 1.0000 "application " | TOPICAL_CREAM | Freq: Two times a day (BID) | CUTANEOUS | 0 refills | Status: DC

## 2017-05-20 NOTE — Progress Notes (Signed)
Patient ID: Courtney Hickman MRN: [id number], DOB: 08/29/1960, 57 y.o. Date of Encounter: 05/20/2017,   Chief Complaint: Physical (CPE)  HPI: 57 y.o. y/o female  here for CPE.   Hard of Hearing--wears hearing aids bilateral.- still hard of hearing--  She reports that she has a couple of concerns that she wants to address.  States that she "has had a cough for years".  Says she "does not know if she has allergies of some sort".  Says that "it is a tickle in her throat that is annoying."   Also states that recently over the past couple of weeks sometimes when she has a bowel movement she has seen some bleeding.  Does not know if it is just from hemorrhoids.  Has used several [employer] brand over-the-counter treatments for hemorrhoids.  Says that she will not see the blood when she uses the treatment but then as soon as she stops the treatment she will see bright red blood on the toilet paper when she has a bowel movement.  Has seen no melena.  Has had no blood at other times -- only when she has bowel movement and wipes -- will sometimes see bright red blood on the toilet paper. I asked if her stool is really hard or if she has a lot of straining with bowel movements.  She says that she has minimal of the symptoms.  Also notes that she is trying to lose weight and has decreased sugar and carbohydrates and has stopped sodas and is drinking water.  Says she has lost 11 pounds so far.  No other specific concerns to address. Here for CPE.  Is fasting.    Review of Systems: Consitutional: No fever, chills, fatigue, night sweats, lymphadenopathy. No significant/unexplained weight changes. Eyes: No visual changes, eye redness, or discharge. ENT/Mouth: No ear pain, sore throat, nasal drainage, or sinus pain. Cardiovascular: No chest pressure,heaviness, tightness or squeezing, even with exertion. No increased shortness of breath or dyspnea on exertion.No palpitations, edema, orthopnea,  PND. Respiratory: No cough, hemoptysis, SOB, or wheezing. Gastrointestinal: No anorexia, dysphagia, reflux, pain, nausea, vomiting, hematemesis, diarrhea,  melena. Breast: No mass, nodules, bulging, or retraction. No skin changes or inflammation. No nipple discharge. No lymphadenopathy. Genitourinary: No dysuria, hematuria, incontinence, vaginal discharge, pruritis, burning, abnormal bleeding, or pain. Musculoskeletal: No decreased ROM, No joint pain or swelling. No significant pain in neck, back, or extremities. Skin: No rash, pruritis, or concerning lesions. Neurological: No headache, dizziness, syncope, seizures, tremors, memory loss, coordination problems, or paresthesias. Psychological: No anxiety, depression, hallucinations, SI/HI. Endocrine: No polydipsia, polyphagia, polyuria, or known diabetes.No increased fatigue. No palpitations/rapid heart rate. No significant/unexplained weight change. All other systems were reviewed and are otherwise negative.  Past Medical History:  Diagnosis Date  . Anemia 08/23/2011   Dr. Benson [place]: EGD, Colonoscopy, Capsule Endo--all negative  . Anemia    Had metrorhagia at that time  . Hearing impairment   . Hypertension      History reviewed. No pertinent surgical history.  Home Meds:  Outpatient Medications Prior to Visit  Medication Sig Dispense Refill  . amLODipine (NORVASC) 10 MG tablet TAKE 1 TABLET BY MOUTH ONCE DAILY 30 tablet 0  . Ferrous Sulfate (IRON) 325 (65 FE) MG TABS Take 1 tablet by mouth daily.    . hydrochlorothiazide (HYDRODIURIL) 25 MG tablet TAKE 1 TABLET BY MOUTH ONCE DAILY 30 tablet 0  . ibuprofen (ADVIL,MOTRIN) 200 MG tablet Take 200 mg by mouth every 6 (six) hours as  needed.    . Multiple Vitamin (MULTIVITAMIN) tablet Take 1 tablet by mouth daily.    Marland Kitchen lisinopril (PRINIVIL,ZESTRIL) 20 MG tablet TAKE 1 TABLET BY MOUTH ONCE DAILY 30 tablet 0  . amoxicillin (AMOXIL) 875 MG tablet Take 1 tablet (875 mg total) by mouth 2 (two) times  daily. 14 tablet 0  . neomycin-polymyxin-hydrocortisone (CORTISPORIN) OTIC solution Place 3 drops into the right ear 4 (four) times daily. For 1 week 10 mL 0   No facility-administered medications prior to visit.     Allergies:  Allergies  Allergen Reactions  . Actifed Cold-Allergy [Chlorpheniramine-Phenyleph Er] Anaphylaxis    Eyes hurt / headache  . Bactrim [Sulfamethoxazole-Trimethoprim] Rash    Social History   Socioeconomic History  . Marital status: Married    Spouse name: Not on file  . Number of children: Not on file  . Years of education: Not on file  . Highest education level: Not on file  Occupational History  . Not on file  Social Needs  . Financial resource strain: Not on file  . Food insecurity:    Worry: Not on file    Inability: Not on file  . Transportation needs:    Medical: Not on file    Non-medical: Not on file  Tobacco Use  . Smoking status: Never Smoker  . Smokeless tobacco: Never Used  Substance and Sexual Activity  . Alcohol use: No  . Drug use: No  . Sexual activity: Not on file  Lifestyle  . Physical activity:    Days per week: Not on file    Minutes per session: Not on file  . Stress: Not on file  Relationships  . Social connections:    Talks on phone: Not on file    Gets together: Not on file    Attends religious service: Not on file    Active member of club or organization: Not on file    Attends meetings of clubs or organizations: Not on file    Relationship status: Not on file  . Intimate partner violence:    Fear of current or ex partner: Not on file    Emotionally abused: Not on file    Physically abused: Not on file    Forced sexual activity: Not on file  Other Topics Concern  . Not on file  Social History Narrative   Married. HomeMaker.   3 children; 16, 71, 63 y/o    History reviewed. No pertinent family history.  Physical Exam: Blood pressure 108/80, pulse (!) 101, temperature 97.7 F (36.5 C), temperature  source Oral, resp. rate 16, height 5\' 2"  (1.575 m), weight 103.1 kg (227 lb 6.4 oz), SpO2 98 %., Body mass index is 41.59 kg/m. General: Obese WF. Hard of Hearing--wears hearing aids bilateral.- still hard of hearing--Appears in no acute distress. HEENT: Normocephalic, atraumatic. Conjunctiva pink, sclera non-icteric. Pupils 2 mm constricting to 1 mm, round, regular, and equally reactive to light and accomodation. EOMI. Internal auditory canal clear. TMs with good cone of light and without pathology. Nasal mucosa pink. Nares are without discharge. No sinus tenderness. Oral mucosa pink. Neck: Supple. Trachea midline. No thyromegaly. Full ROM. No lymphadenopathy.No Carotid Bruits. Lungs: Clear to auscultation bilaterally without wheezes, rales, or rhonchi. Breathing is of normal effort and unlabored. Cardiovascular: RRR with S1 S2. No murmurs, rubs, or gallops. Distal pulses 2+ symmetrically. No carotid or abdominal bruits. Breast: Symmetrical. No masses. Nipples without discharge. Abdomen: Soft, non-tender, non-distended with normoactive bowel sounds. No hepatosplenomegaly or  masses. No rebound/guarding. No CVA tenderness. No hernias.  Genitourinary:  External genitalia without lesions. Vaginal mucosa pink.No discharge present. Cervix pink and without discharge. No cervical tenderness.Normal uterus size. No adnexal mass or tenderness.   Anoscopy: diffuse erythema in the perineum and skin crease between the buttocks. Small hemorrhoids present.  No mass.  No fissure or fistula seen. Musculoskeletal: Full range of motion and 5/5 strength throughout.  Skin: Warm and moist without erythema, ecchymosis, wounds, or rash. Neuro: A+Ox3. CN II-XII grossly intact. Moves all extremities spontaneously. Full sensation throughout. Normal gait.  Psych:  Responds to questions appropriately with a normal affect.   Assessment/Plan:  57 y.o. y/o female here for CPE  1. Encounter for preventive health examination  A.  Screening Labs: - CBC with Differential/Platelet - COMPLETE METABOLIC PANEL WITH GFR - Lipid panel - TSH  B. Pap: She had Pap smear performed here with me 05/17/2014.  Negative HPV.  Negative cytology.  Wait 5 years to repeat.  C. Screening Mammogram: She has not had a mammogram in over 2 years.  Had them at [facility] in the past.  She is agreeable for me to go ahead and schedule follow-up. - MM DIGITAL SCREENING BILATERAL; Future  D. DEXA/BMD:  Wait till closer to age 4 to start bone density scan.  E. Colorectal Cancer Screening: She had colonoscopy 10/04/2011-- per patient-- normal-- repeat 10 years.  F. Immunizations:  Influenza:---------------N/A Tetanus:---------------- 09/19/2011 Pneumococcal:------- no indication until age 20 Shingrix:---------------- she is to check with her insurance regarding coverage and cost and if she is interested in getting this we will get this at the pharmacy     2. Essential hypertension States that she "has had a cough for years".  Says she "does not know if she has allergies of some sort".  Says that "it is a tickle in her throat that is annoying." Suspect this is secondary to ACE inhibitor.  Will discontinue lisinopril .  Replace with losartan 50 mg.  Return for follow-up visit in 2 weeks to recheck BP and bmet---and to make sure cough has resolved. - losartan (COZAAR) 50 MG tablet; Take 1 tablet (50 mg total) by mouth daily.  Dispense: 30 tablet; Refill: 0 - COMPLETE METABOLIC PANEL WITH GFR  3. Cutaneous candidiasis - nystatin cream (MYCOSTATIN); Apply 1 application topically 2 (two) times daily.  Dispense: 30 g; Refill: 0  4. Hemorrhoids, unspecified hemorrhoid type Colonoscopy was normal in 2013 per patient. Exam I am seeing findings consistent with candidiasis of the skin and also hemorrhoids.  Suspect that the hematochezia that she has visualized secondary to these.  Treat these.  Will reassess at follow-up visit in 2 weeks to see  hematochezia resolved.  See #2 above regarding follow-up 2 weeks. - hydrocortisone (ANUSOL-HC) 25 MG suppository; Place 1 suppository (25 mg total) rectally every 12 (twelve) hours.  Dispense: 12 suppository; Refill: 1    Signed, [address] [place], [place], [facility] 05/20/2017 11:15 AM

## 2017-06-05 ENCOUNTER — Encounter: Payer: Self-pay | Admitting: Physician Assistant

## 2017-06-05 ENCOUNTER — Ambulatory Visit (INDEPENDENT_AMBULATORY_CARE_PROVIDER_SITE_OTHER): Admitting: Physician Assistant

## 2017-06-05 VITALS — BP 130/82 | HR 86 | Temp 97.6°F | Ht 62.0 in | Wt 223.2 lb

## 2017-06-05 DIAGNOSIS — I10 Essential (primary) hypertension: Secondary | ICD-10-CM | POA: Diagnosis not present

## 2017-06-05 LAB — BASIC METABOLIC PANEL WITH GFR
BUN / CREAT RATIO: 17 (calc) (ref 6–22)
BUN: 18 mg/dL (ref 7–25)
CO2: 24 mmol/L (ref 20–32)
CREATININE: 1.07 mg/dL — AB (ref 0.50–1.05)
Calcium: 11.2 mg/dL — ABNORMAL HIGH (ref 8.6–10.4)
Chloride: 104 mmol/L (ref 98–110)
GFR, EST AFRICAN AMERICAN: 68 mL/min/{1.73_m2} (ref >=60)
GFR, EST NON AFRICAN AMERICAN: 58 mL/min/{1.73_m2} — AB (ref >=60)
Glucose, Bld: 86 mg/dL (ref 65–99)
Potassium: 4.2 mmol/L (ref 3.5–5.3)
Sodium: 141 mmol/L (ref 135–146)

## 2017-06-05 LAB — EXTRA LAV TOP TUBE

## 2017-06-05 MED ORDER — LOSARTAN POTASSIUM 50 MG PO TABS: 50.0000 mg | ORAL_TABLET | Freq: Every day | ORAL | 1 refills | Status: DC

## 2017-06-05 MED ORDER — HYDROCHLOROTHIAZIDE 25 MG PO TABS: 25.0000 mg | ORAL_TABLET | Freq: Every day | ORAL | 1 refills | Status: DC

## 2017-06-05 MED ORDER — AMLODIPINE BESYLATE 10 MG PO TABS: 10.0000 mg | ORAL_TABLET | Freq: Every day | ORAL | 1 refills | Status: DC

## 2017-06-05 NOTE — Progress Notes (Signed)
    Patient ID: Courtney Hickman MRN: [id number], DOB: Jun 30, 1960, 57 y.o. Date of Encounter: 06/05/2017, 8:27 AM    Chief Complaint:  Chief Complaint  Patient presents with  . Hypertension    Patient in for 2 week recheck on BP      HPI: 57 y.o. year old female presents for f/u regarding HTN medication change.    Recently had visit with me for CPE on 05/20/2017. At that visit she reported that she been having a cough for years.  Reporteed that it was a tickle in her throat that was annoying.  At that visit I felt that it may be secondary to her ACE inhibitor.  Had her discontinue lisinopril and replaced with losartan 50 mg daily.  Planned for follow-up visit in 2 weeks to recheck BP and be met after med change.  Today she reports that the cough definitely improved.  States that within 1 day after medicine change she could tell significant decrease in cough.   Home Meds:   Outpatient Medications Prior to Visit  Medication Sig Dispense Refill  . Ferrous Sulfate (IRON) 325 (65 FE) MG TABS Take 1 tablet by mouth daily.    . hydrocortisone (ANUSOL-HC) 25 MG suppository Place 1 suppository (25 mg total) rectally every 12 (twelve) hours. 12 suppository 1  . ibuprofen (ADVIL,MOTRIN) 200 MG tablet Take 200 mg by mouth every 6 (six) hours as needed.    . Multiple Vitamin (MULTIVITAMIN) tablet Take 1 tablet by mouth daily.    Marland Kitchen nystatin cream (MYCOSTATIN) Apply 1 application topically 2 (two) times daily. 30 g 0  . amLODipine (NORVASC) 10 MG tablet TAKE 1 TABLET BY MOUTH ONCE DAILY 30 tablet 0  . hydrochlorothiazide (HYDRODIURIL) 25 MG tablet TAKE 1 TABLET BY MOUTH ONCE DAILY 30 tablet 0  . losartan (COZAAR) 50 MG tablet Take 1 tablet (50 mg total) by mouth daily. 30 tablet 0   No facility-administered medications prior to visit.     Allergies:  Allergies  Allergen Reactions  . Actifed Cold-Allergy [Chlorpheniramine-Phenyleph Er] Anaphylaxis    Eyes hurt / headache  . Bactrim  [Sulfamethoxazole-Trimethoprim] Rash      Review of Systems: See HPI for pertinent ROS. All other ROS negative.    Physical Exam: Blood pressure 130/82, pulse 86, temperature 97.6 F (36.4 C), temperature source Oral, height '5\' 2"'$  (1.575 m), weight 101.3 kg (223 lb 4 oz), SpO2 95 %., Body mass index is 40.83 kg/m. General:  WF. Appears in no acute distress. Neck: Supple. No thyromegaly. No lymphadenopathy. Lungs: Clear bilaterally to auscultation without wheezes, rales, or rhonchi. Breathing is unlabored. Heart: Regular rhythm. No murmurs, rubs, or gallops. Msk:  Strength and tone normal for age. Extremities/Skin: Warm and dry.  Neuro: Alert and oriented X 3. Moves all extremities spontaneously. Gait is normal. CNII-XII grossly in tact. Psych:  Responds to questions appropriately with a normal affect.     ASSESSMENT AND PLAN:  57 y.o. year old female with  1. Essential hypertension Blood pressure is controlled/at goal.  Continue current medications.  Check BME T to follow-up lab after recent med change. Plan for routine follow-up visit in 6 months.  Follow-up sooner if needed. - BASIC METABOLIC PANEL WITH GFR - losartan (COZAAR) 50 MG tablet; Take 1 tablet (50 mg total) by mouth daily.  Dispense: 90 tablet; Refill: 1   Signed, [address] [place], [place], [facility] 06/05/2017 8:27 AM

## 2017-06-19 ENCOUNTER — Ambulatory Visit
Admission: RE | Admit: 2017-06-19 | Discharge: 2017-06-19 | Disposition: A | Discharge status: Home / Self Care | Source: Ambulatory Visit | Attending: Physician Assistant | Admitting: Physician Assistant

## 2017-06-19 DIAGNOSIS — Z0000 Encounter for general adult medical examination without abnormal findings: Secondary | ICD-10-CM

## 2017-12-05 ENCOUNTER — Encounter: Payer: Self-pay | Admitting: Family Medicine

## 2017-12-05 ENCOUNTER — Ambulatory Visit (INDEPENDENT_AMBULATORY_CARE_PROVIDER_SITE_OTHER): Admitting: Family Medicine

## 2017-12-05 ENCOUNTER — Other Ambulatory Visit: Payer: Self-pay

## 2017-12-05 VITALS — BP 134/80 | HR 86 | Temp 98.7°F | Resp 14 | Ht 62.0 in | Wt 187.0 lb

## 2017-12-05 DIAGNOSIS — Z6834 Body mass index (BMI) 34.0-34.9, adult: Secondary | ICD-10-CM | POA: Diagnosis not present

## 2017-12-05 DIAGNOSIS — E669 Obesity, unspecified: Secondary | ICD-10-CM

## 2017-12-05 DIAGNOSIS — D649 Anemia, unspecified: Secondary | ICD-10-CM

## 2017-12-05 DIAGNOSIS — I10 Essential (primary) hypertension: Secondary | ICD-10-CM | POA: Diagnosis not present

## 2017-12-05 LAB — LIPID PANEL
CHOL/HDL RATIO: 4.5 (calc) (ref <5.0)
Cholesterol: 170 mg/dL (ref <200)
HDL: 38 mg/dL — ABNORMAL LOW (ref >50)
LDL Cholesterol (Calc): 112 mg/dL (calc) — ABNORMAL HIGH
NON-HDL CHOLESTEROL (CALC): 132 mg/dL — AB (ref <130)
Triglycerides: 97 mg/dL (ref <150)

## 2017-12-05 LAB — CBC WITH DIFFERENTIAL/PLATELET
BASOS ABS: 28 {cells}/uL (ref 0–200)
Basophils Relative: 0.5 %
EOS PCT: 3.3 %
Eosinophils Absolute: 182 cells/uL (ref 15–500)
HEMATOCRIT: 46.1 % — AB (ref 35.0–45.0)
HEMOGLOBIN: 16.3 g/dL — AB (ref 11.7–15.5)
LYMPHS ABS: 1634 {cells}/uL (ref 850–3900)
MCH: 30.6 pg (ref 27.0–33.0)
MCHC: 35.4 g/dL (ref 32.0–36.0)
MCV: 86.7 fL (ref 80.0–100.0)
MPV: 11.7 fL (ref 7.5–12.5)
Monocytes Relative: 7.1 %
NEUTROS ABS: 3267 {cells}/uL (ref 1500–7800)
Neutrophils Relative %: 59.4 %
Platelets: 225 10*3/uL (ref 140–400)
RBC: 5.32 10*6/uL — ABNORMAL HIGH (ref 3.80–5.10)
RDW: 14.2 % (ref 11.0–15.0)
Total Lymphocyte: 29.7 %
WBC: 5.5 10*3/uL (ref 3.8–10.8)
WBCMIX: 391 {cells}/uL (ref 200–950)

## 2017-12-05 LAB — COMPLETE METABOLIC PANEL WITH GFR
AG RATIO: 1.8 (calc) (ref 1.0–2.5)
ALBUMIN MSPROF: 4.6 g/dL (ref 3.6–5.1)
ALT: 25 U/L (ref 6–29)
AST: 13 U/L (ref 10–35)
Alkaline phosphatase (APISO): 57 U/L (ref 33–130)
BUN: 21 mg/dL (ref 7–25)
CALCIUM: 11.5 mg/dL — AB (ref 8.6–10.4)
CO2: 27 mmol/L (ref 20–32)
CREATININE: 0.83 mg/dL (ref 0.50–1.05)
Chloride: 104 mmol/L (ref 98–110)
GFR, EST NON AFRICAN AMERICAN: 79 mL/min/{1.73_m2} (ref >=60)
GFR, Est African American: 92 mL/min/{1.73_m2} (ref >=60)
GLOBULIN: 2.6 g/dL (ref 1.9–3.7)
Glucose, Bld: 83 mg/dL (ref 65–99)
Potassium: 4.4 mmol/L (ref 3.5–5.3)
SODIUM: 143 mmol/L (ref 135–146)
Total Bilirubin: 0.6 mg/dL (ref 0.2–1.2)
Total Protein: 7.2 g/dL (ref 6.1–8.1)

## 2017-12-05 MED ORDER — LOSARTAN POTASSIUM 100 MG PO TABS: 100.0000 mg | ORAL_TABLET | Freq: Every day | ORAL | 0 refills | Status: DC

## 2017-12-05 NOTE — Patient Instructions (Signed)
Start taking losartan 100 mg in the morning and continue your hydrochlorthiazide  Stop your Norvasc/amlodipine  Monitor your blood pressure on the chart given to you  Follow-up in 1 month for recheck, however follow-up sooner if your blood pressures are elevated over 140/90 on average after 2-3 weeks.  Or sooner if symptomatic with higher BP readings.  I will be rechecking her blood levels to see if we can stop the iron because for the past 4 years I do not see any history of anemia

## 2017-12-05 NOTE — Progress Notes (Signed)
Patient ID: ONYA EUTSLER, female    DOB: [birth year], 57 y.o.   MRN: [id number]  PCP: Orlena Sheldon, PA-C  Chief Complaint  Patient presents with  . Follow-up    HTN- would like to change Amlodipine d/t vision issues that OPTH thinks are due to meds    Subjective:   TALA EBER is a 57 y.o. female, presents to clinic with CC of need to change BP meds due to recommendations from optometrist.  She had blurry vision during exam in June of this year.  She had continued to take the medicine and continues to have blurry vision.  She noticed a change in her vision several years ago, but also has had her same optometrist say that she should change meds. She has a BP machine at home, currently not checking at home.    Recently changed meds with Karis Juba, stopped lisinopril due to cough and started losartan and continued HCTZ and norvasc, which she had been on for several years.  Coughing resolved, BP was at goal and pt comes in today to inquire about stopped norvasc per her optometrist recommendations.  She has been compliant with taking amlodipine and other meds without any other side effects that she is concerned about.  Hypertension, follow-up:  BP Readings from Last 3 Encounters:  12/05/17 134/80  06/05/17 130/82  05/20/17 108/80    Patient Active Problem List   Diagnosis Date Noted  . Obesity 04/27/2013  . Hypertension   . Hearing impairment   . Anemia     Current Meds  Medication Sig  . amLODipine (NORVASC) 10 MG tablet Take 1 tablet (10 mg total) by mouth daily.  . Ferrous Sulfate (IRON) 325 (65 FE) MG TABS Take 1 tablet by mouth daily.  . hydrochlorothiazide (HYDRODIURIL) 25 MG tablet Take 1 tablet (25 mg total) by mouth daily.  . hydrocortisone (ANUSOL-HC) 25 MG suppository Place 1 suppository (25 mg total) rectally every 12 (twelve) hours.  Marland Kitchen ibuprofen (ADVIL,MOTRIN) 200 MG tablet Take 200 mg by mouth every 6 (six) hours as needed.  Marland Kitchen losartan (COZAAR) 50 MG  tablet Take 1 tablet (50 mg total) by mouth daily.  . Multiple Vitamin (MULTIVITAMIN) tablet Take 1 tablet by mouth daily.     Review of Systems  Constitutional: Negative.   HENT: Negative.   Eyes: Negative.   Respiratory: Negative.   Cardiovascular: Negative.   Gastrointestinal: Negative.   Endocrine: Negative.   Genitourinary: Negative.   Musculoskeletal: Negative.   Skin: Negative.   Allergic/Immunologic: Negative.   Neurological: Negative.   Hematological: Negative.   Psychiatric/Behavioral: Negative.   All other systems reviewed and are negative.      Objective:    Vitals:   12/05/17 1117  BP: 134/80  Pulse: 86  Resp: 14  Temp: 98.7 F (37.1 C)  TempSrc: Oral  SpO2: 100%  Weight: 187 lb (84.8 kg)  Height: 5\' 2"  (1.575 m)      Physical Exam  Constitutional: She appears well-developed and well-nourished. No distress.  HENT:  Head: Normocephalic and atraumatic.  Nose: Nose normal.  Eyes: Conjunctivae are normal. Right eye exhibits no discharge. Left eye exhibits no discharge.  Neck: No tracheal deviation present.  Cardiovascular: Normal rate, regular rhythm, normal heart sounds and intact distal pulses. Exam reveals no gallop, no friction rub and no decreased pulses.  No murmur heard. Pulses:      Radial pulses are 2+ on the right side, and 2+ on the  left side.       Dorsalis pedis pulses are 2+ on the right side, and 2+ on the left side.       Posterior tibial pulses are 2+ on the right side, and 2+ on the left side.  No LE edema  Pulmonary/Chest: Effort normal and breath sounds normal. No stridor. No respiratory distress. She has no wheezes. She has no rales.  Musculoskeletal: Normal range of motion.  Neurological: She is alert. She exhibits normal muscle tone. Coordination normal.  Skin: Skin is warm and dry. No rash noted. She is not diaphoretic.  Psychiatric: She has a normal mood and affect. Her behavior is normal.  Nursing note and vitals  reviewed.         Assessment & Plan:   Problem List Items Addressed This Visit      Cardiovascular and Mediastinum   Hypertension - Primary   Relevant Medications   losartan (COZAAR) 100 MG tablet   Other Relevant Orders   COMPLETE METABOLIC PANEL WITH GFR (Completed)   Lipid Panel (Completed)     Other   Anemia   Relevant Orders   CBC with Differential (Completed)   Obesity   Relevant Orders   COMPLETE METABOLIC PANEL WITH GFR (Completed)   Lipid Panel (Completed)      BP med changes, currently controlled on meds, no SE except for vision concerns from optometrist.  Will try to change meds to avoid norvasc and see if that improves her vision when she goes back to have it rechecked.  Blood pressure medication changes, will increase losartan and discontinue Norvasc and monitor blood pressures, recheck kidney function in 1 month, she has parameters to follow-up with [place] sooner but I anticipate that it will take about 2 weeks for her blood pressure to become steady with the new medication changes.. Encouraged to contact us sooner if over 160/100 or if on average greater than 140/90 with symptoms.  Will recheck her vision symptoms at that time and see if we needed ophthalmology referral  When reviewing patient's lab work over the past several years noticed that she has had normal hemoglobin and even elevated hemoglobin with her last lab work and she is currently supplementing with iron every other day which is causing constipation abdominal bloating.  She does have a history of anemia that she reports in the past and I see in the chart however back to 2016 I do not see any anemia in her blood work, will recheck CBC today and see if we can discontinue iron supplementation.    Delsa Grana, PA-C 12/05/17 11:38 AM

## 2017-12-07 ENCOUNTER — Other Ambulatory Visit: Payer: Self-pay | Admitting: *Deleted

## 2017-12-09 ENCOUNTER — Telehealth: Payer: Self-pay | Admitting: Family Medicine

## 2017-12-09 NOTE — Telephone Encounter (Signed)
Patient called in asking if she is to continue the HCTZ alongside the Losartan. Patient states it was not called in and she was just wondering if she needs to still take? If so she will need refills. Please advise?

## 2017-12-10 ENCOUNTER — Other Ambulatory Visit: Payer: Self-pay | Admitting: Family Medicine

## 2017-12-10 MED ORDER — HYDROCHLOROTHIAZIDE 25 MG PO TABS: 25.0000 mg | ORAL_TABLET | Freq: Every day | ORAL | 1 refills | Status: DC

## 2017-12-10 NOTE — Telephone Encounter (Signed)
Yes- on her discharge instruction plan was to stop norvasc and take losartan and HCTZ.  Refill sent in.

## 2017-12-11 NOTE — Telephone Encounter (Signed)
Spoke with patient and informed her that she is to continue Losartan and HCTZ and that HCTZ was sent into the pharmacy. Patient verbalized understanding.

## 2017-12-11 NOTE — Telephone Encounter (Signed)
Left message return call

## 2018-01-09 ENCOUNTER — Encounter: Payer: Self-pay | Admitting: Family Medicine

## 2018-01-09 ENCOUNTER — Ambulatory Visit (INDEPENDENT_AMBULATORY_CARE_PROVIDER_SITE_OTHER): Admitting: Family Medicine

## 2018-01-09 VITALS — BP 118/80 | HR 70 | Temp 97.8°F | Ht 62.0 in | Wt 181.4 lb

## 2018-01-09 DIAGNOSIS — I10 Essential (primary) hypertension: Secondary | ICD-10-CM | POA: Diagnosis not present

## 2018-01-09 DIAGNOSIS — D582 Other hemoglobinopathies: Secondary | ICD-10-CM

## 2018-01-09 DIAGNOSIS — R718 Other abnormality of red blood cells: Secondary | ICD-10-CM

## 2018-01-09 DIAGNOSIS — R7989 Other specified abnormal findings of blood chemistry: Secondary | ICD-10-CM

## 2018-01-09 LAB — CBC WITH DIFFERENTIAL/PLATELET
ABSOLUTE MONOCYTES: 346 {cells}/uL (ref 200–950)
BASOS PCT: 0.6 %
Basophils Absolute: 29 cells/uL (ref 0–200)
EOS ABS: 130 {cells}/uL (ref 15–500)
Eosinophils Relative: 2.7 %
HEMATOCRIT: 46.3 % — AB (ref 35.0–45.0)
Hemoglobin: 15.7 g/dL — ABNORMAL HIGH (ref 11.7–15.5)
LYMPHS ABS: 1579 {cells}/uL (ref 850–3900)
MCH: 30.7 pg (ref 27.0–33.0)
MCHC: 33.9 g/dL (ref 32.0–36.0)
MCV: 90.6 fL (ref 80.0–100.0)
MPV: 12.1 fL (ref 7.5–12.5)
Monocytes Relative: 7.2 %
NEUTROS PCT: 56.6 %
Neutro Abs: 2717 cells/uL (ref 1500–7800)
PLATELETS: 184 10*3/uL (ref 140–400)
RBC: 5.11 10*6/uL — AB (ref 3.80–5.10)
RDW: 13.9 % (ref 11.0–15.0)
TOTAL LYMPHOCYTE: 32.9 %
WBC: 4.8 10*3/uL (ref 3.8–10.8)

## 2018-01-09 NOTE — Progress Notes (Signed)
Patient ID: Courtney Hickman, female    DOB: 09-27-1960, 57 y.o.   MRN: [id number]  PCP: Orlena Sheldon, PA-C  Chief Complaint  Patient presents with  . Hypertension    Patient in today for a follow up for hypertension. Has no other concerns this visit.     Subjective:   Courtney Hickman is a 57 y.o. female, presents to clinic with CC of HTN f/up.  She is taking losartan 100 mg, HCTZ 25.  She denies any SE since medications were adjusted.  She says that she has trouble making herself drink liquids during the winter months because she does not feel is thirsty and 1 day she knows she did not drink very much and she felt slightly lightheaded.  She did not check her blood pressure because she has been unable to find her blood pressure cuff at home.  Otherwise she has not had any symptoms.  Her lab work was checked previously for well visit and with routine issues and it was noted that her hemoglobin was elevated.  She had been supplementing with iron for several years after being diagnosed with anemia.  In the past month she has stopped the iron supplementation.  No history of smoking.  Calcium was also elevated with her last lab work.  She has no history of gallstones, she feels that she has some mood swings secondary to perimenopausal symptoms but no depression or anxiety, no abdominal pain.  Denies any history of thyroid disease or parathyroid disease or bone density problems that she knows of.  Patient Active Problem List   Diagnosis Date Noted  . Obesity 04/27/2013  . Hypertension   . Hearing impairment   . Anemia      Prior to Admission medications   Medication Sig Start Date End Date Taking? Authorizing Provider  Ferrous Sulfate (IRON) 325 (65 FE) MG TABS Take 1 tablet by mouth daily.   Yes [provider]  hydrochlorothiazide (HYDRODIURIL) 25 MG tablet Take 1 tablet (25 mg total) by mouth daily. 12/10/17  Yes Delsa Grana, PA-C  hydrocortisone (ANUSOL-HC) 25 MG suppository  Place 1 suppository (25 mg total) rectally every 12 (twelve) hours. 05/20/17 05/20/18 Yes Orlena Sheldon, PA-C  ibuprofen (ADVIL,MOTRIN) 200 MG tablet Take 200 mg by mouth every 6 (six) hours as needed.   Yes [provider]  losartan (COZAAR) 100 MG tablet Take 1 tablet (100 mg total) by mouth daily. 12/05/17  Yes Delsa Grana, PA-C  Multiple Vitamin (MULTIVITAMIN) tablet Take 1 tablet by mouth daily.   Yes [provider]     Allergies  Allergen Reactions  . Actifed Cold-Allergy [Chlorpheniramine-Phenyleph Er] Anaphylaxis    Eyes hurt / headache  . Bactrim [Sulfamethoxazole-Trimethoprim] Rash     No family history on file.   Social History   Socioeconomic History  . Marital status: Married    Spouse name: Not on file  . Number of children: Not on file  . Years of education: Not on file  . Highest education level: Not on file  Occupational History  . Not on file  Social Needs  . Financial resource strain: Not on file  . Food insecurity:    Worry: Not on file    Inability: Not on file  . Transportation needs:    Medical: Not on file    Non-medical: Not on file  Tobacco Use  . Smoking status: Never Smoker  . Smokeless tobacco: Never Used  Substance and Sexual Activity  .  Alcohol use: No  . Drug use: No  . Sexual activity: Not on file  Lifestyle  . Physical activity:    Days per week: Not on file    Minutes per session: Not on file  . Stress: Not on file  Relationships  . Social connections:    Talks on phone: Not on file    Gets together: Not on file    Attends religious service: Not on file    Active member of club or organization: Not on file    Attends meetings of clubs or organizations: Not on file    Relationship status: Not on file  . Intimate partner violence:    Fear of current or ex partner: Not on file    Emotionally abused: Not on file    Physically abused: Not on file    Forced sexual activity: Not on file  Other Topics Concern    . Not on file  Social History Narrative   Married. HomeMaker.   3 children; 16, 67, 45 y/o     Review of Systems  Constitutional: Negative.   HENT: Negative.   Eyes: Negative.   Respiratory: Negative.   Cardiovascular: Negative.   Gastrointestinal: Negative.   Endocrine: Negative.   Genitourinary: Negative.   Musculoskeletal: Negative.   Skin: Negative.   Allergic/Immunologic: Negative.   Neurological: Negative.   Hematological: Negative.   Psychiatric/Behavioral: Negative.   All other systems reviewed and are negative.      Objective:    Vitals:   01/09/18 1054  BP: 118/80  Pulse: 70  Temp: 97.8 F (36.6 C)  TempSrc: Oral  SpO2: 96%  Weight: 181 lb 6 oz (82.3 kg)  Height: 5\' 2"  (1.575 m)      Physical Exam Vitals signs and nursing note reviewed.  Constitutional:      General: She is not in acute distress.    Appearance: She is well-developed. She is not ill-appearing, toxic-appearing or diaphoretic.  HENT:     Head: Normocephalic and atraumatic.     Nose: Nose normal.     Mouth/Throat:     Mouth: Mucous membranes are moist.     Pharynx: Oropharynx is clear.  Eyes:     General: No scleral icterus.       Right eye: No discharge.        Left eye: No discharge.     Conjunctiva/sclera: Conjunctivae normal.  Neck:     Trachea: No tracheal deviation.  Cardiovascular:     Rate and Rhythm: Normal rate and regular rhythm.     Heart sounds: No murmur. No friction rub. No gallop.   Pulmonary:     Effort: Pulmonary effort is normal. No respiratory distress.     Breath sounds: Normal breath sounds. No stridor. No wheezing, rhonchi or rales.  Musculoskeletal: Normal range of motion.  Skin:    General: Skin is warm and dry.     Capillary Refill: Capillary refill takes less than 2 seconds.     Findings: No rash.  Neurological:     Mental Status: She is alert.     Motor: No abnormal muscle tone.     Coordination: Coordination normal.  Psychiatric:         Mood and Affect: Mood normal.        Behavior: Behavior normal.           Assessment & Plan:      ICD-10-CM   1. Essential hypertension N82 COMPLETE METABOLIC PANEL WITH GFR  med changes, BP at goal, no SE, recheck renal function/electrolytes  2. Hypercalcemia V29.19 COMPLETE METABOLIC PANEL WITH GFR   recheck calcium  3. Elevated ferritin, hemoglobin, and red blood cell count (HCC) R79.89 CBC with Differential   D58.2    R71.8    Has stopped iron supplement, recheck CBC    Patient was instructed to continue to drink and push fluids, check her blood pressure periodically and especially find a way to check it if she gets lightheaded.  If for some reason she has not drinking enough and she feels lightheaded or blood pressure is low she was instructed to hold hydrochlorothiazide and try and continue to take losartan.  Follow-up depending on lab results.  She does not have any symptoms concerning for hypercalcemia, will recheck labs today, may be incidental  Recheck kidney function and CBC   Delsa Grana, PA-C 01/09/18 11:10 AM

## 2018-01-09 NOTE — Patient Instructions (Signed)
Your blood pressure is at goal today and is well controlled.  If you can continue to monitor at home that would be great but you can also check it periodically while you are out and about it pharmacies or some grocery stores.  If you feel lightheaded I will please try and get it checked.  If you having difficulty drinking enough fluids or felt lightheaded over some reason get a stomach bug in her sick I would hold her hydrochlorothiazide and continue taking your losartan to prevent your blood pressure from getting too low.  I will call you with your labs and we will see when we need to follow-up next.

## 2018-01-10 LAB — COMPLETE METABOLIC PANEL WITH GFR
AG Ratio: 2 (calc) (ref 1.0–2.5)
ALBUMIN MSPROF: 4.5 g/dL (ref 3.6–5.1)
ALT: 24 U/L (ref 6–29)
AST: 16 U/L (ref 10–35)
Alkaline phosphatase (APISO): 61 U/L (ref 33–130)
BUN: 19 mg/dL (ref 7–25)
CALCIUM: 11.4 mg/dL — AB (ref 8.6–10.4)
CO2: 26 mmol/L (ref 20–32)
Chloride: 106 mmol/L (ref 98–110)
Creat: 0.99 mg/dL (ref 0.50–1.05)
GFR, EST NON AFRICAN AMERICAN: 64 mL/min/{1.73_m2} (ref >=60)
GFR, Est African American: 74 mL/min/{1.73_m2} (ref >=60)
GLOBULIN: 2.2 g/dL (ref 1.9–3.7)
Glucose, Bld: 82 mg/dL (ref 65–99)
POTASSIUM: 4.4 mmol/L (ref 3.5–5.3)
SODIUM: 143 mmol/L (ref 135–146)
Total Bilirubin: 0.7 mg/dL (ref 0.2–1.2)
Total Protein: 6.7 g/dL (ref 6.1–8.1)

## 2018-01-16 ENCOUNTER — Other Ambulatory Visit: Payer: Self-pay | Admitting: Physician Assistant

## 2018-01-16 ENCOUNTER — Other Ambulatory Visit

## 2018-01-24 LAB — NO SPECIMEN RECEIVED: Tests (Ordered): 8837

## 2018-01-24 LAB — PTH, INTACT AND CALCIUM: PTH: 29 pg/mL (ref 14–64)

## 2018-01-27 ENCOUNTER — Other Ambulatory Visit

## 2018-01-28 LAB — CALCIUM: CALCIUM: 10.9 mg/dL — AB (ref 8.6–10.4)

## 2018-02-03 ENCOUNTER — Ambulatory Visit (INDEPENDENT_AMBULATORY_CARE_PROVIDER_SITE_OTHER): Admitting: Family Medicine

## 2018-02-03 ENCOUNTER — Encounter: Payer: Self-pay | Admitting: Family Medicine

## 2018-02-03 MED ORDER — AMLODIPINE BESYLATE 5 MG PO TABS: 5.0000 mg | ORAL_TABLET | Freq: Every day | ORAL | 3 refills | Status: DC

## 2018-02-03 NOTE — Progress Notes (Signed)
Subjective:    Patient ID: Courtney Hickman, female    DOB: April 19, 1960, 58 y.o.   MRN: [id number]  HPI Patient is a very pleasant 58 year old Caucasian female here today to discuss hypercalcemia.  She had previously been seeing my partner who has since retired.  I was reviewing her lab work and discovered that her calcium level was significantly elevated at 11.5.  Patient was taking hydrochlorothiazide at the time and therefore we had the patient hold the hydrochlorothiazide.  We also recommended that she avoid any calcium supplements or vitamin D supplements and then recheck her calcium.  It remained persistently elevated.  We then checked the PTH level to evaluate for primary hyperparathyroidism however PTH was in the lower end of normal at 29.  Made hyperparathyroidism unlikely.  Patient is here today further to discuss.  She denies taking any Tums, Rolaids, or any other antacids.  She does not drink a large quantity of milk.  He denies eating a large quantity of dairy products.  She denies unusual bone pain, fevers, chills, night sweats.  She denies any history of sarcoidosis in her family or asthma-like symptoms.  She does have a family history of hypercalcemia however in her mother however this was attributed to her mother using a lot of antacids.  She denies any history of granulomatous disease.  Review of her chart shows that her hypercalcemia dates back as far as 2016.  The last normal calcium level that I see was dated October 28, 2013.  She has been between 10.6-11.5 every single lab draws since then.  Past Medical History:  Diagnosis Date  . Anemia 08/23/2011   Dr. Benson [place]: EGD, Colonoscopy, Capsule Endo--all negative  . Anemia    Had metrorhagia at that time  . Hearing impairment   . Hypertension    Patient is hearing impaired.  This is been since birth.  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 No past surgical history on file. Current Outpatient Medications on File Prior to Visit  Medication Sig Dispense Refill  . hydrocortisone (ANUSOL-HC) 25 MG suppository Place 1 suppository (25 mg total) rectally every 12 (twelve) hours. 12 suppository 1  . ibuprofen (ADVIL,MOTRIN) 200 MG tablet Take 200 mg by mouth every 6 (six) hours as needed.    Marland Kitchen losartan (COZAAR) 100 MG tablet Take 1 tablet (100 mg total) by mouth daily. 90 tablet 0  . Multiple Vitamin (MULTIVITAMIN) tablet Take 1 tablet by mouth daily.     No current facility-administered medications on file prior to visit.    Allergies  Allergen Reactions  . Actifed Cold-Allergy [Chlorpheniramine-Phenyleph Er] Anaphylaxis    Eyes hurt / headache  . Bactrim [Sulfamethoxazole-Trimethoprim] Rash   Social History   Socioeconomic History  . Marital status: Married    Spouse name: Not on file  . Number of children: Not on file  . Years of education: Not on file  . Highest education level: Not on file  Occupational History  . Not on file  Social Needs  . Financial resource strain: Not on file  . Food insecurity:    Worry: Not on file    Inability: Not on file  . Transportation needs:    Medical: Not on file    Non-medical: Not on file  Tobacco Use  . Smoking status: Never Smoker  . Smokeless tobacco: Never Used  Substance and Sexual Activity  . Alcohol use: No  . Drug use: No  . Sexual activity: Not on file  Lifestyle  . Physical activity:    Days per week: Not on file    Minutes per session: Not on file  . Stress: Not on file  Relationships  . Social connections:    Talks on phone: Not on file    Gets together: Not on file    Attends religious service: Not on file    Active member of club or organization: Not on file    Attends meetings of clubs or organizations: Not on file    Relationship status: Not on file  . Intimate partner violence:    Fear of current or ex partner: Not on file    Emotionally  abused: Not on file    Physically abused: Not on file    Forced sexual activity: Not on file  Other Topics Concern  . Not on file  Social History Narrative   Married. HomeMaker.   3 children; 16, 42, 78 y/o      Review of Systems  All other systems reviewed and are negative.      Objective:   Physical Exam Vitals signs reviewed.  Constitutional:      Appearance: Normal appearance.  Neck:     Musculoskeletal: Normal range of motion.  Cardiovascular:     Rate and Rhythm: Normal rate and regular rhythm.     Heart sounds: Normal heart sounds. No murmur.  Pulmonary:     Effort: Pulmonary effort is normal. No respiratory distress.     Breath sounds: Normal breath sounds. No stridor. No wheezing, rhonchi or rales.  Abdominal:     General: Bowel sounds are normal.     Palpations: Abdomen is soft.  Neurological:     Mental Status: She is alert.           Assessment & Plan:  Hypercalcemia - Plan: PTH-Related Peptide, Angiotensin converting enzyme, VITAMIN D 25 Hydroxy (Vit-D Deficiency, Fractures), Vitamin D 1,25 dihydroxy, CBC with Differential/Platelet, TSH  Hypercalcemia has persisted despite stopping hydrochlorothiazide.  PTH was in the low end of normal at 29.  Although it still possible this could be primary hyperparathyroidism coupled with hydrochlorothiazide, I would expect her PTH to be upper end of normal or even elevated rather than low end of normal.  Therefore I believe this is unlikely.  Therefore I will check a PTH related peptide to rule out malignancy associated her calcium Mia.  I will check an angiotensin-converting enzyme level to rule out sarcoidosis.  I will check a vitamin D 25 hydroxy to rule out ingestion.  I will check a vitamin D 1, 25 dihydroxy to rule out extrarenal production from granulomatous disease.  I will check a CBC to evaluate for evidence of bone marrow abnormality such as lymphoma or leukemia.  I will check a TSH to evaluate for  hypothyroidism.  Consider a chest x-ray.  Patient's last alkaline phosphatase on record was 45.  This is normal but this was in 2016.  Therefore I will check a CMP to evaluate for elevated alkaline phosphatase suggesting Paget's disease or increased bone turnover there would suggest the need for a skeletal survey.  If all of this proves to be normal, I would recommend an endocrinology consult.

## 2018-02-04 LAB — CBC WITH DIFFERENTIAL/PLATELET
Absolute Monocytes: 314 cells/uL (ref 200–950)
Basophils Absolute: 29 cells/uL (ref 0–200)
Basophils Relative: 0.6 %
Eosinophils Absolute: 132 cells/uL (ref 15–500)
Eosinophils Relative: 2.7 %
HCT: 46.1 % — ABNORMAL HIGH (ref 35.0–45.0)
HEMOGLOBIN: 16 g/dL — AB (ref 11.7–15.5)
Lymphs Abs: 1377 cells/uL (ref 850–3900)
MCH: 31.3 pg (ref 27.0–33.0)
MCHC: 34.7 g/dL (ref 32.0–36.0)
MCV: 90 fL (ref 80.0–100.0)
MONOS PCT: 6.4 %
MPV: 11.7 fL (ref 7.5–12.5)
Neutro Abs: 3048 cells/uL (ref 1500–7800)
Neutrophils Relative %: 62.2 %
Platelets: 192 10*3/uL (ref 140–400)
RBC: 5.12 10*6/uL — ABNORMAL HIGH (ref 3.80–5.10)
RDW: 14.1 % (ref 11.0–15.0)
Total Lymphocyte: 28.1 %
WBC: 4.9 10*3/uL (ref 3.8–10.8)

## 2018-02-04 LAB — TSH: TSH: 1.57 mIU/L (ref 0.40–4.50)

## 2018-02-04 LAB — VITAMIN D 25 HYDROXY (VIT D DEFICIENCY, FRACTURES): Vit D, 25-Hydroxy: 44 ng/mL (ref 30–100)

## 2018-02-06 LAB — COMPLETE METABOLIC PANEL WITH GFR
AG Ratio: 1.8 (calc) (ref 1.0–2.5)
ALT: 20 U/L (ref 6–29)
AST: 11 U/L (ref 10–35)
Albumin: 4.3 g/dL (ref 3.6–5.1)
Alkaline phosphatase (APISO): 67 U/L (ref 33–130)
BUN: 16 mg/dL (ref 7–25)
CO2: 21 mmol/L (ref 20–32)
Calcium: 10.7 mg/dL — ABNORMAL HIGH (ref 8.6–10.4)
Chloride: 105 mmol/L (ref 98–110)
Creat: 0.79 mg/dL (ref 0.50–1.05)
GFR, EST NON AFRICAN AMERICAN: 84 mL/min/{1.73_m2} (ref >=60)
GFR, Est African American: 97 mL/min/{1.73_m2} (ref >=60)
Globulin: 2.4 g/dL (calc) (ref 1.9–3.7)
Glucose, Bld: 72 mg/dL (ref 65–99)
Potassium: 4.1 mmol/L (ref 3.5–5.3)
Sodium: 143 mmol/L (ref 135–146)
Total Bilirubin: 0.7 mg/dL (ref 0.2–1.2)
Total Protein: 6.7 g/dL (ref 6.1–8.1)

## 2018-02-06 LAB — VITAMIN D 1,25 DIHYDROXY
VITAMIN D 1, 25 (OH) TOTAL: 54 pg/mL (ref 18–72)
Vitamin D2 1, 25 (OH)2: <8 pg/mL
Vitamin D3 1, 25 (OH)2: 54 pg/mL

## 2018-02-07 LAB — ANGIOTENSIN CONVERTING ENZYME: ANGIOTENSIN-CONVERTING ENZYME: 27 U/L (ref 9–67)

## 2018-02-07 LAB — PTH-RELATED PEPTIDE: PTH-RELATED PROTEIN (PTH-RP): 11 pg/mL — AB (ref 14–27)

## 2018-02-14 ENCOUNTER — Other Ambulatory Visit: Payer: Self-pay | Admitting: Family Medicine

## 2018-03-15 ENCOUNTER — Other Ambulatory Visit: Payer: Self-pay | Admitting: Family Medicine

## 2018-03-15 DIAGNOSIS — I10 Essential (primary) hypertension: Secondary | ICD-10-CM

## 2018-03-20 ENCOUNTER — Other Ambulatory Visit: Payer: Self-pay | Admitting: Family Medicine

## 2018-03-20 DIAGNOSIS — I10 Essential (primary) hypertension: Secondary | ICD-10-CM

## 2018-04-08 ENCOUNTER — Other Ambulatory Visit: Payer: Self-pay

## 2018-04-08 ENCOUNTER — Ambulatory Visit (INDEPENDENT_AMBULATORY_CARE_PROVIDER_SITE_OTHER): Admitting: "Endocrinology

## 2018-04-08 ENCOUNTER — Encounter: Payer: Self-pay | Admitting: "Endocrinology

## 2018-04-08 NOTE — Progress Notes (Signed)
Endocrinology Consult Note                                            04/08/2018, 5:12 PM   Subjective:    Patient ID: Courtney Hickman, female    DOB: [birth year], PCP Dennard Schaumann Cammie Mcgee, MD   Past Medical History:  Diagnosis Date  . Anemia 08/23/2011   Dr. Benson Norway: EGD, Colonoscopy, Capsule Endo--all negative  . Anemia    Had metrorhagia at that time  . Hearing impairment   . Hypertension    History reviewed. No pertinent surgical history. Social History   Socioeconomic History  . Marital status: Married    Spouse name: Not on file  . Number of children: Not on file  . Years of education: Not on file  . Highest education level: Not on file  Occupational History  . Not on file  Social Needs  . Financial resource strain: Not on file  . Food insecurity:    Worry: Not on file    Inability: Not on file  . Transportation needs:    Medical: Not on file    Non-medical: Not on file  Tobacco Use  . Smoking status: Never Smoker  . Smokeless tobacco: Never Used  Substance and Sexual Activity  . Alcohol use: No  . Drug use: No  . Sexual activity: Not on file  Lifestyle  . Physical activity:    Days per week: Not on file    Minutes per session: Not on file  . Stress: Not on file  Relationships  . Social connections:    Talks on phone: Not on file    Gets together: Not on file    Attends religious service: Not on file    Active member of club or organization: Not on file    Attends meetings of clubs or organizations: Not on file    Relationship status: Not on file  Other Topics Concern  . Not on file  Social History Narrative   Married. HomeMaker.   3 children; 30, 72, 56 y/o   Family History  Problem Relation Age of Onset  . Diabetes Mother    Outpatient Encounter Medications as of 04/08/2018  Medication Sig  . amLODipine (NORVASC) 5 MG tablet Take 1 tablet (5 mg total) by mouth daily.  Marland Kitchen losartan (COZAAR) 100 MG tablet TAKE 1 TABLET BY MOUTH ONCE DAILY  .  [DISCONTINUED] hydrocortisone (ANUSOL-HC) 25 MG suppository Place 1 suppository (25 mg total) rectally every 12 (twelve) hours.  . [DISCONTINUED] ibuprofen (ADVIL,MOTRIN) 200 MG tablet Take 200 mg by mouth every 6 (six) hours as needed.  . [DISCONTINUED] Multiple Vitamin (MULTIVITAMIN) tablet Take 1 tablet by mouth daily.   No facility-administered encounter medications on file as of 04/08/2018.    ALLERGIES: Allergies  Allergen Reactions  . Actifed Cold-Allergy [Chlorpheniramine-Phenyleph Er] Anaphylaxis    Eyes hurt / headache  . Bactrim [Sulfamethoxazole-Trimethoprim] Rash    VACCINATION STATUS: Immunization History  Administered Date(s) Administered  . Influenza,inj,Quad PF,6+ Mos 10/13/2012, 10/28/2013  . Tdap 09/19/2011    HPI Courtney Hickman is 58 y.o. female who presents today with a medical history as above. she is being seen in consultation for hypercalcemia requested by Susy Frizzle, MD.  -Patient denies any prior history of parathyroid dysfunction.  She denies any history of nephrolithiasis, osteoporosis/osteopenia, seizure disorder,  cardiac dysrhythmias.  She was found to have hypercalcemia of 11.4 mg per DL in December 2019.  Her subsequent labs show slow improvement in her calcium down to 10.7 mg per DL.  She reports that she was taking various kinds of supplements including multivitamins during the time of her diagnosis with hypercalcemia.  She has stopped some of her supplements, however still takes some others, did not bring any of them to review today. -Her labs showed low normal PTH, and low PTH related peptide.  She did not have 24-hour urine calcium measurements.  -She did not have any bone density measurements.   -She has seen a 6 pound weight loss since December 2019 due to her recent engagement with ketogenic diet.  Denies any history of thyrotoxicosis, CKD, sarcoidosis, she denies any complaint of chronic cough.  Review of Systems  Constitutional: + recent  weight loss, no fatigue, no subjective hyperthermia, no subjective hypothermia Eyes: no blurry vision, no xerophthalmia ENT: no sore throat, no nodules palpated in throat, no dysphagia/odynophagia, no hoarseness Cardiovascular: no Chest Pain, no Shortness of Breath, no palpitations, no leg swelling Respiratory: no cough, no shortness of breath Gastrointestinal: no Nausea/Vomiting/Diarhhea Musculoskeletal: no muscle/joint aches Skin: no rashes Neurological: no tremors, no numbness, no tingling, no dizziness Psychiatric: no depression, no anxiety  Objective:    BP (!) 144/81   Pulse 77   Ht 5\' 2"  (1.575 m)   Wt 175 lb (79.4 kg)   BMI 32.01 kg/m   Wt Readings from Last 3 Encounters:  04/08/18 175 lb (79.4 kg)  02/03/18 180 lb (81.6 kg)  01/09/18 181 lb 6 oz (82.3 kg)    Physical Exam  Constitutional: + obese for height, not in acute distress, normal state of mind Eyes: PERRLA, EOMI, no exophthalmos ENT: moist mucous membranes, no gross thyromegaly, no gross cervical lymphadenopathy Cardiovascular: normal precordial activity, Regular Rate and Rhythm, no Murmur/Rubs/Gallops Respiratory:  adequate breathing efforts, no gross chest deformity, Clear to auscultation bilaterally Gastrointestinal: abdomen soft, Non -tender, No distension, Bowel Sounds present, no gross organomegaly Musculoskeletal: no gross deformities, strength intact in all four extremities Skin: moist, warm, no rashes Neurological:  + Hard of hearing, no tremor with outstretched hands, Deep tendon reflexes normal in bilateral lower extremities.  CMP ( most recent) CMP     Component Value Date/Time   NA 143 02/03/2018 1254   K 4.1 02/03/2018 1254   CL 105 02/03/2018 1254   CO2 21 02/03/2018 1254   GLUCOSE 72 02/03/2018 1254   BUN 16 02/03/2018 1254   CREATININE 0.79 02/03/2018 1254   CALCIUM 10.7 (H) 02/03/2018 1254   PROT 6.7 02/03/2018 1254   ALBUMIN 4.0 05/17/2014 1110   AST 11 02/03/2018 1254   ALT 20  02/03/2018 1254   ALKPHOS 45 05/17/2014 1110   BILITOT 0.7 02/03/2018 1254   GFRNONAA 84 02/03/2018 1254   GFRAA 97 02/03/2018 1254    Lipid Panel     Component Value Date/Time   CHOL 170 12/05/2017 1201   TRIG 97 12/05/2017 1201   HDL 38 (L) 12/05/2017 1201   CHOLHDL 4.5 12/05/2017 1201   VLDL 36 05/17/2014 1110   LDLCALC 112 (H) 12/05/2017 1201      Lab Results  Component Value Date   TSH 1.57 02/03/2018   TSH 0.79 05/20/2017   TSH 0.917 05/17/2014        Results for Courtney Hickman, Courtney Hickman (MRN [id number]) as of 04/08/2018 17:20  Ref. Range 01/16/2018 15:22 02/03/2018 12:45 02/03/2018  12:46 02/03/2018 12:54  Glucose Latest Ref Range: 65 - 99 mg/dL    72  PTH, Intact Latest Ref Range: 14 - 64 pg/mL 29     PTH-Related Protein (PTH-RP) Latest Ref Range: 14 - 27 pg/mL   11 (L)   TSH Latest Ref Range: 0.40 - 4.50 mIU/L  1.57       Assessment & Plan:   1. Hypercalcemia  Assessment: 1. Hypercalcemia  Plan: Patient has had several instances of elevated calcium, with the highest level being at 11.4 mg/dL.,  Subsequent labs show close to normal calcium of 10.7.  He never had high PTH, corresponding intact PTH in December 2019 was 29 and PTH related peptide was low at 11.    -She appears to have PTH independent hypercalcemia which is resolving.  Differentials include excessive inadvertent calcium  Ingestion along with her supplements.  -She does not have vitamin D deficiency.  No apparent complications from hypercalcemia:: no history of  nephrolithiasis,  osteoporosis,fragility fractures. No abdominal pain, no major mood disorders, no bone pain.  - I discussed with the patient about the physiology of calcium and parathyroid hormone, and possible  effects of  increased Calcium , including kidney stones, cardiac dysrhythmias, osteoporosis, abdominal pain, etc.   - The work up so far is not sufficient to reach a conclusion for definitive therapy.  - She  needs more studies to confirm  etiology of hypercalcemia.   I will proceed to obtain  repeat intact PTH/calcium, serum magnesium, serum phosphorus.  It is also essential to obtain 24-hour urine calcium/creatinine to rule out the rare but important cause of mild elevation in calcium and PTH- [place] ( Familial Hypocalciuric Hypercalcemia), which may not require any active intervention.  - I will request for bone density scan to include the distal  33% of  radius for evaluation of cortical bone, which is predominantly affected by hyperparathyroidism.   - I did not initiate any new prescriptions today. - I advised her  to maintain close follow up with Susy Frizzle, MD for primary care needs.  - Time spent with the patient: 45 minutes, of which >50% was spent in obtaining information about her symptoms, reviewing her previous labs/studies,  evaluations, and treatments, counseling her about her hypercalcemia, and developing a plan to confirm the diagnosis and long term treatment based on the latest standards of care/guidelines.    Courtney Hickman participated in the discussions, expressed understanding, and voiced agreement with the above plans.  All questions were answered to her satisfaction. she is encouraged to contact clinic should she have any questions or concerns prior to her return visit.  Follow up plan: Return in about 2 weeks (around 04/22/2018) for Labs Today- Non-Fasting Ok, Follow up with Bone Density.   Glade Lloyd, MD [facility] [address] [place], [place] [address] Phone: [phone]  Fax: [phone]     04/08/2018, 5:12 PM  This note was partially dictated with voice recognition software. Similar sounding words can be transcribed inadequately or may not  be corrected upon review.

## 2018-04-11 LAB — CREATININE, URINE, 24 HOUR: Creatinine, 24H Ur: 0.99 g/(24.h) (ref 0.50–2.15)

## 2018-04-11 LAB — PHOSPHORUS: Phosphorus: 3.6 mg/dL (ref 2.5–4.5)

## 2018-04-11 LAB — CALCIUM, URINE, 24 HOUR: Calcium, 24H Urine: 442 mg/24 h — ABNORMAL HIGH

## 2018-04-11 LAB — T4, FREE: FREE T4: 1.4 ng/dL (ref 0.8–1.8)

## 2018-04-11 LAB — PTH, INTACT AND CALCIUM
Calcium: 11 mg/dL — ABNORMAL HIGH (ref 8.6–10.4)
PTH: 100 pg/mL — ABNORMAL HIGH (ref 14–64)

## 2018-04-11 LAB — TSH: TSH: 1.55 mIU/L (ref 0.40–4.50)

## 2018-04-11 LAB — MAGNESIUM: Magnesium: 2.2 mg/dL (ref 1.5–2.5)

## 2018-04-22 ENCOUNTER — Encounter: Payer: Self-pay | Admitting: "Endocrinology

## 2018-04-22 ENCOUNTER — Ambulatory Visit (INDEPENDENT_AMBULATORY_CARE_PROVIDER_SITE_OTHER): Admitting: "Endocrinology

## 2018-04-22 DIAGNOSIS — E210 Primary hyperparathyroidism: Secondary | ICD-10-CM | POA: Diagnosis not present

## 2018-04-22 NOTE — Progress Notes (Signed)
04/22/2018, 11:13 AM                                                    Endocrinology Telephone Visit Follow up Note -During COVID -19 Pandemic   Subjective:    Patient ID: Courtney Hickman, female    DOB: Oct 14, 1960, PCP Dennard Schaumann Cammie Mcgee, MD   Past Medical History:  Diagnosis Date  . Anemia 08/23/2011   Dr. Benson Norway: EGD, Colonoscopy, Capsule Endo--all negative  . Anemia    Had metrorhagia at that time  . Hearing impairment   . Hypertension    History reviewed. No pertinent surgical history. Social History   Socioeconomic History  . Marital status: Married    Spouse name: Not on file  . Number of children: Not on file  . Years of education: Not on file  . Highest education level: Not on file  Occupational History  . Not on file  Social Needs  . Financial resource strain: Not on file  . Food insecurity:    Worry: Not on file    Inability: Not on file  . Transportation needs:    Medical: Not on file    Non-medical: Not on file  Tobacco Use  . Smoking status: Never Smoker  . Smokeless tobacco: Never Used  Substance and Sexual Activity  . Alcohol use: No  . Drug use: No  . Sexual activity: Not on file  Lifestyle  . Physical activity:    Days per week: Not on file    Minutes per session: Not on file  . Stress: Not on file  Relationships  . Social connections:    Talks on phone: Not on file    Gets together: Not on file    Attends religious service: Not on file    Active member of club or organization: Not on file    Attends meetings of clubs or organizations: Not on file    Relationship status: Not on file  Other Topics Concern  . Not on file  Social History Narrative   Married. HomeMaker.   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 Diabetes Mother    Outpatient Encounter Medications as of 04/22/2018  Medication Sig  . amLODipine (NORVASC) 5 MG tablet Take 1 tablet (5 mg  total) by mouth daily.  Marland Kitchen losartan (COZAAR) 100 MG tablet TAKE 1 TABLET BY MOUTH ONCE DAILY   No facility-administered encounter medications on file as of 04/22/2018.    ALLERGIES: Allergies  Allergen Reactions  . Actifed Cold-Allergy [Chlorpheniramine-Phenyleph Er] Anaphylaxis    Eyes hurt / headache  . Bactrim [Sulfamethoxazole-Trimethoprim] Rash    VACCINATION STATUS: Immunization History  Administered Date(s) Administered  . Influenza,inj,Quad PF,6+ Mos 10/13/2012, 10/28/2013  . Tdap 09/19/2011    HPI Courtney Hickman is 58 y.o. female who presents today with a medical history as above. she is engaged in telephone visit to follow  after our consult for hypercalcemia.   -Her subsequent work-up confirms primary hyperparathyroidism as a cause  of hypercalcemia.     - She denies any history of nephrolithiasis, osteoporosis/osteopenia, seizure disorder, cardiac dysrhythmias.  She was found to have hypercalcemia of 11.4 mg per DL in December 2019.  Her subsequent labs show slow improvement in her calcium down to 10.7 mg per DL.  She reports that she was taking various kinds of supplements including multivitamins during the time of her diagnosis with hypercalcemia.  She has stopped some of her supplements, however still takes some others, did not bring any of them to review today.  -Her repeat labs on April 10, 2018 showed PTH elevated at 100 with associated hypercalcemia of 11.0 mg per DL.  Associated magnesium was 2.2 mg per DL, phosphorus 3.6 mg per DL.  She has normal thyroid function test.  Her 24-hour urine calcium was significantly elevated at 442 mg / 24 hours. -  Denies any history of thyrotoxicosis, CKD, sarcoidosis, she denies any complaint of chronic cough.    Objective:    There were no vitals taken for this visit.  Wt Readings from Last 3 Encounters:  04/08/18 175 lb (79.4 kg)  02/03/18 180 lb (81.6 kg)  01/09/18 181 lb 6 oz (82.3 kg)   CMP     Component Value  Date/Time   NA 143 02/03/2018 1254   K 4.1 02/03/2018 1254   CL 105 02/03/2018 1254   CO2 21 02/03/2018 1254   GLUCOSE 72 02/03/2018 1254   BUN 16 02/03/2018 1254   CREATININE 0.79 02/03/2018 1254   CALCIUM 11.0 (H) 04/10/2018 1050   PROT 6.7 02/03/2018 1254   ALBUMIN 4.0 05/17/2014 1110   AST 11 02/03/2018 1254   ALT 20 02/03/2018 1254   ALKPHOS 45 05/17/2014 1110   BILITOT 0.7 02/03/2018 1254   GFRNONAA 84 02/03/2018 1254   GFRAA 97 02/03/2018 1254    Lipid Panel     Component Value Date/Time   CHOL 170 12/05/2017 1201   TRIG 97 12/05/2017 1201   HDL 38 (L) 12/05/2017 1201   CHOLHDL 4.5 12/05/2017 1201   VLDL 36 05/17/2014 1110   LDLCALC 112 (H) 12/05/2017 1201      Lab Results  Component Value Date   TSH 1.55 04/10/2018   TSH 1.57 02/03/2018   TSH 0.79 05/20/2017   TSH 0.917 05/17/2014   FREET4 1.4 04/10/2018    Results for Courtney Hickman, Courtney Hickman (MRN [id number]) as of 04/22/2018 11:18  Ref. Range 04/10/2018 10:50  PTH, Intact Latest Ref Range: 14 - 64 pg/mL 100 (H)  TSH Latest Ref Range: 0.40 - 4.50 mIU/L 1.55  T4,Free(Direct) Latest Ref Range: 0.8 - 1.8 ng/dL 1.4      Assessment & Plan:   1. Hypercalcemia  Assessment: 1. Hypercalcemia  2.  Primary hyperparathyroidism Plan: Patient has had several instances of elevated calcium, with the highest level being at 11.4 mg/dL. -Her most recent labs show PTH 100 associated with hypercalcemia of 11, and hypercalciuria with 24-hour urine calcium at 442.  -This is consistent with PTH dependent hypercalcemia likely as a result of primary hyperparathyroidism.  -In December 2019 she underwent PTH related peptide which was low at 11.  -She does not have vitamin D deficiency.  No apparent complications from hypercalcemia: no history of  nephrolithiasis,  osteoporosis,fragility fractures. No abdominal pain, no major mood disorders, no bone pain.  -She is a surgical candidate.  I discussed this plan with the patient and  initiated referral to Dr. Armandina Gemma for parathyroidectomy.  She will return to clinic with  repeat labs after her surgery for follow-up.  - I will request for bone density scan to include the distal  33% of  radius for evaluation of cortical bone, which is predominantly affected by hyperparathyroidism.   - I did not initiate any new prescriptions today. - I advised her  to maintain close follow up with Susy Frizzle, MD for primary care needs.    Follow up plan: Return in about 4 months (around 08/22/2018) for Follow up with Labs after Surgery.   Glade Lloyd, MD [facility] [address] [place], [place] [address] Phone: [phone]  Fax: [phone]     04/22/2018, 11:13 AM  This note was partially dictated with voice recognition software. Similar sounding words can be transcribed inadequately or may not  be corrected upon review.

## 2018-05-15 ENCOUNTER — Other Ambulatory Visit (HOSPITAL_COMMUNITY): Payer: Self-pay | Admitting: Surgery

## 2018-05-15 ENCOUNTER — Telehealth: Payer: Self-pay | Admitting: Family Medicine

## 2018-05-15 ENCOUNTER — Other Ambulatory Visit: Payer: Self-pay | Admitting: Surgery

## 2018-05-15 DIAGNOSIS — E210 Primary hyperparathyroidism: Secondary | ICD-10-CM

## 2018-05-15 NOTE — Telephone Encounter (Signed)
Pt called and states that her BP has been on a steady incline for the past few months. While at [facility] yesterday her BP was 152/90. She has not checked her BP regularly and has not checked her BP outside of MD office. She is taking amlodipine & losartan.

## 2018-05-15 NOTE — Telephone Encounter (Signed)
Could add cardura 2 mg poqday

## 2018-05-16 MED ORDER — DOXAZOSIN MESYLATE 2 MG PO TABS: 2.0000 mg | ORAL_TABLET | Freq: Every day | ORAL | 1 refills | Status: DC

## 2018-05-16 NOTE — Telephone Encounter (Signed)
Pt aware, med sent to pharm and informed pt to drop by here in 2 weeks for [place] to recheck her bp.

## 2018-05-30 ENCOUNTER — Telehealth: Payer: Self-pay | Admitting: Family Medicine

## 2018-05-30 ENCOUNTER — Other Ambulatory Visit: Payer: Self-pay

## 2018-05-30 ENCOUNTER — Ambulatory Visit

## 2018-05-30 VITALS — BP 140/96

## 2018-05-30 DIAGNOSIS — Z0130 Encounter for examination of blood pressure without abnormal findings: Secondary | ICD-10-CM

## 2018-05-30 NOTE — Telephone Encounter (Signed)
Patient came in today for a blood pressure check. Her first initial bp was 150/98 while sitting in the left arm. I allowed patient to sit for 10 minutes and rechecked her blood pressure and it was 140/96 in the left arm at rest. Patient also brought in blood pressure reading from home. Please see below:  04/22 152/90 04/24 143/88 04/25 141/87 04/28 148/85 04/29 149/89 04/30 147/88 05/01 147/85 05/02 131/64 05/04/154/70, 125/75 05/06 131/84 05/08 137/84  Please advise?

## 2018-05-30 NOTE — Progress Notes (Signed)
Patient came in today for a blood pressure check. Her first initial bp was 150/98 while sitting in the left arm. I allowed patient to sit for 10 minutes and rechecked her blood pressure and it was 140/96 in the left arm at rest. Patient also brought in blood pressure reading from home. Please see below:  04/22 152/90 04/24 143/88 04/25 141/87 04/28 148/85 04/29 149/89 04/30 147/88 05/01 147/85 05/02 131/64 05/04/154/70, 125/75 05/06 131/84 05/08 137/84  Will notify patient's PCP.

## 2018-05-30 NOTE — Telephone Encounter (Signed)
Left message return call

## 2018-05-30 NOTE — Telephone Encounter (Signed)
I would up amlodipine to 10 mg poqday

## 2018-06-05 MED ORDER — AMLODIPINE BESYLATE 10 MG PO TABS: 10.0000 mg | ORAL_TABLET | Freq: Every day | ORAL | 1 refills | Status: DC

## 2018-06-05 NOTE — Telephone Encounter (Signed)
Spoke with patient and informed her of medication increase. Patient verbalized understanding.

## 2018-06-05 NOTE — Addendum Note (Signed)
Addended by: Ofilia Neas R on: 06/05/2018 12:23 PM   Modules accepted: Orders

## 2018-06-15 ENCOUNTER — Other Ambulatory Visit: Payer: Self-pay | Admitting: Family Medicine

## 2018-06-15 DIAGNOSIS — I10 Essential (primary) hypertension: Secondary | ICD-10-CM

## 2018-06-24 ENCOUNTER — Encounter (HOSPITAL_COMMUNITY)
Admission: RE | Admit: 2018-06-24 | Discharge: 2018-06-24 | Disposition: A | Discharge status: Home / Self Care | Source: Ambulatory Visit | Attending: Surgery | Admitting: Surgery

## 2018-06-24 ENCOUNTER — Other Ambulatory Visit: Payer: Self-pay

## 2018-06-24 ENCOUNTER — Ambulatory Visit (HOSPITAL_COMMUNITY)
Admission: RE | Admit: 2018-06-24 | Discharge: 2018-06-24 | Disposition: A | Discharge status: Home / Self Care | Source: Ambulatory Visit | Attending: Surgery | Admitting: Surgery

## 2018-06-24 DIAGNOSIS — E210 Primary hyperparathyroidism: Secondary | ICD-10-CM | POA: Diagnosis present

## 2018-06-24 MED ORDER — TECHNETIUM TC 99M SESTAMIBI GENERIC - CARDIOLITE
25.0000 | Freq: Once | INTRAVENOUS | Status: AC | PRN
  Administered 2018-06-24: 09:00:00 25.2 via INTRAVENOUS

## 2018-07-30 ENCOUNTER — Ambulatory Visit: Payer: Self-pay | Admitting: Surgery

## 2018-08-21 ENCOUNTER — Other Ambulatory Visit: Payer: Self-pay | Admitting: "Endocrinology

## 2018-08-21 ENCOUNTER — Ambulatory Visit: Admitting: "Endocrinology

## 2018-08-21 DIAGNOSIS — E210 Primary hyperparathyroidism: Secondary | ICD-10-CM

## 2018-09-12 ENCOUNTER — Encounter (HOSPITAL_COMMUNITY): Payer: Self-pay

## 2018-09-12 ENCOUNTER — Other Ambulatory Visit: Payer: Self-pay

## 2018-09-12 DIAGNOSIS — Z20822 Contact with and (suspected) exposure to covid-19: Secondary | ICD-10-CM

## 2018-09-12 NOTE — Patient Instructions (Signed)
DUE TO COVID-19 ONLY ONE VISITOR IS ALLOWED TO COME WITH YOU AND STAY IN THE WAITING ROOM ONLY DURING PRE OP AND PROCEDURE. THE ONE VISITOR MAY VISIT WITH YOU IN YOUR PRIVATE ROOM DURING VISITING HOURS ONLY!!   COVID SWAB TESTING COMPLETED ON: Tuesday, Sep 16, 2018.  [address], [place] -[facility] enter pre surgical testing line (Must self quarantine after testing. Follow instructions on handout.)             Your procedure is scheduled on: Friday, Aug. 28, 2020   Report to [facility] Main  Entrance   Report to Short Stay at 5:30 AM   Call this number if you have problems the morning of surgery [phone]   Do not eat food or drink liquids :After Midnight.   Brush your teeth the morning of surgery.   Do NOT smoke after Midnight   Take these medicines the morning of surgery with A SIP OF WATER: Amlodipine, Doxazosin                               You may not have any metal on your body including hair pins, jewelry, and body piercings             Do not wear make-up, lotions, powders, perfumes/cologne, or deodorant             Do not wear nail polish.  Do not shave  48 hours prior to surgery.               Do not bring valuables to the hospital. [place].   Contacts, dentures or bridgework may not be worn into surgery.    Patients discharged the day of surgery will not be allowed to drive home.   Special Instructions: Bring a copy of your healthcare power of attorney and living will documents         the day of surgery if you haven't scanned them in before.              Please read over the following fact sheets you were given:  [facility] - Preparing for Surgery Before surgery, you can play an important role.  Because skin is not sterile, your skin needs to be as free of germs as possible.  You can reduce the number of germs on your skin by washing with CHG (chlorahexidine gluconate) soap  before surgery.  CHG is an antiseptic cleaner which kills germs and bonds with the skin to continue killing germs even after washing. Please DO NOT use if you have an allergy to CHG or antibacterial soaps.  If your skin becomes reddened/irritated stop using the CHG and inform your nurse when you arrive at Short Stay. Do not shave (including legs and underarms) for at least 48 hours prior to the first CHG shower.  You may shave your face/neck.  Please follow these instructions carefully:  1.  Shower with CHG Soap the night before surgery and the  morning of surgery.  2.  If you choose to wash your hair, wash your hair first as usual with your normal  shampoo.  3.  After you shampoo, rinse your hair and body thoroughly to remove the shampoo.  4.  Use CHG as you would any other liquid soap.  You can apply chg directly to the skin and wash.  Gently with a scrungie or clean washcloth.  5.  Apply the CHG Soap to your body ONLY FROM THE NECK DOWN.   Do   not use on face/ open                           Wound or open sores. Avoid contact with eyes, ears mouth and   genitals (private parts).                       Wash face,  Genitals (private parts) with your normal soap.             6.  Wash thoroughly, paying special attention to the area where your    surgery  will be performed.  7.  Thoroughly rinse your body with warm water from the neck down.  8.  DO NOT shower/wash with your normal soap after using and rinsing off the CHG Soap.                9.  Pat yourself dry with a clean towel.            10.  Wear clean pajamas.            11.  Place clean sheets on your bed the night of your first shower and do not  sleep with pets. Day of Surgery : Do not apply any lotions/deodorants the morning of surgery.  Please wear clean clothes to the hospital/surgery center.  FAILURE TO FOLLOW THESE INSTRUCTIONS MAY RESULT IN THE CANCELLATION OF YOUR SURGERY  PATIENT  SIGNATURE_________________________________  NURSE SIGNATURE__________________________________  ________________________________________________________________________

## 2018-09-14 ENCOUNTER — Encounter: Payer: Self-pay | Admitting: Surgery

## 2018-09-14 LAB — NOVEL CORONAVIRUS, NAA: SARS-CoV-2, NAA: NOT DETECTED

## 2018-09-14 NOTE — H&P (Signed)
General Surgery [facility]  Courtney Hickman DOB: Jul 22, 1960 Married / Language: English / Race: White Female   History of Present Illness  The patient is a 58 year old female who presents with primary hyperparathyroidism.  CHIEF COMPLAINT: primary hyperparathyroidism  Patient is referred by Dr. Bud Face for surgical evaluation and management of primary hyperparathyroidism. Patient had been noted by her primary care provider to have elevated serum calcium levels in the fall of 2019. Upon looking back in earlier records, the patient had had hypercalcemia for some time. She was referred to endocrinology. Recent testing included a serum calcium level which was elevated at 11.0, intact PTH level which was elevated at 100, and a 24-hour urine collection for calcium which was elevated at 442. Vitamin D levels were normal. Patient had been taking hydrochlorothiazide and this had been discontinued a few months ago. Patient was also taking calcium supplements which were discontinued. Patient has not had any imaging studies. She denies any complications. She has no history of nephrolithiasis. She has not had a bone density scan but does have that study ordered. She has had no recent fractures. She denies fatigue. There is no family history of thyroid or parathyroid disease. There is no family history of other endocrine neoplasm. Patient has had no prior head or neck surgery. She presents today for further evaluation and recommendations.   Past Surgical History Cesarean Section - Multiple  Colon Polyp Removal - Colonoscopy  Oral Surgery   Diagnostic Studies History Colonoscopy  5-10 years ago Mammogram  1-3 years ago Pap Smear  1-5 years ago  Allergies Sulfa Drugs  Acephen *ANALGESICS - NonNarcotic*  Social History Alcohol use  Occasional alcohol use. Caffeine use  Coffee. No drug use  Tobacco use  Never smoker.  Family History Arthritis   Mother. Colon Polyps  Mother, Sister. Diabetes Mellitus  Brother, Mother. Heart Disease  Brother. Hypertension  Brother, Father, Mother. Kidney Disease  Brother. Respiratory Condition  Brother.  Pregnancy / Birth History  Age at menarche  69 years. Age of menopause  64-60 Contraceptive History  Oral contraceptives. Gravida  4 Irregular periods  Maternal age  34-30 Para  3  Other Problems  Back Pain  Hemorrhoids  High blood pressure   Review of Systems General Present- Night Sweats. Not Present- Appetite Loss, Chills, Fatigue, Fever, Weight Gain and Weight Loss. Skin Not Present- Change in Wart/Mole, Dryness, Hives, Jaundice, New Lesions, Non-Healing Wounds, Rash and Ulcer. HEENT Present- Hearing Loss and Wears glasses/contact lenses. Not Present- Earache, Hoarseness, Nose Bleed, Oral Ulcers, Ringing in the Ears, Seasonal Allergies, Sinus Pain, Sore Throat, Visual Disturbances and Yellow Eyes. Respiratory Present- Snoring. Not Present- Bloody sputum, Chronic Cough, Difficulty Breathing and Wheezing. Breast Not Present- Breast Mass, Breast Pain, Nipple Discharge and Skin Changes. Cardiovascular Present- Palpitations. Not Present- Chest Pain, Difficulty Breathing Lying Down, Leg Cramps, Rapid Heart Rate, Shortness of Breath and Swelling of Extremities. Gastrointestinal Present- Constipation. Not Present- Abdominal Pain, Bloating, Bloody Stool, Change in Bowel Habits, Chronic diarrhea, Difficulty Swallowing, Excessive gas, Gets full quickly at meals, Hemorrhoids, Indigestion, Nausea, Rectal Pain and Vomiting. Female Genitourinary Not Present- Frequency, Nocturia, Painful Urination, Pelvic Pain and Urgency. Musculoskeletal Present- Joint Pain and Joint Stiffness. Not Present- Back Pain, Muscle Pain, Muscle Weakness and Swelling of Extremities. Neurological Not Present- Decreased Memory, Fainting, Headaches, Numbness, Seizures, Tingling, Tremor, Trouble walking and  Weakness. Psychiatric Not Present- Anxiety, Bipolar, Change in Sleep Pattern, Depression, Fearful and Frequent crying. Endocrine Present- Hair  Changes and Hot flashes. Not Present- Cold Intolerance, Excessive Hunger, Heat Intolerance and New Diabetes. Hematology Not Present- Blood Thinners, Easy Bruising, Excessive bleeding, Gland problems, HIV and Persistent Infections.  Vitals  Weight: 171 lb Height: 61in Body Surface Area: 1.77 m Body Mass Index: 32.31 kg/m  Temp.: 98.104F (Oral)  Pulse: 98 (Regular)  P.OX: 90% (Room air) BP: 152/90(Sitting, Left Arm, Standard)  Physical Exam  See vital signs recorded above  GENERAL APPEARANCE Development: normal Nutritional status: normal Gross deformities: none  SKIN Rash, lesions, ulcers: none Induration, erythema: none Nodules: none palpable  EYES Conjunctiva and lids: normal Pupils: equal and reactive Iris: normal bilaterally  EARS, NOSE, MOUTH, THROAT External ears: no lesion or deformity External nose: no lesion or deformity Hearing: grossly normal Lips: no lesion or deformity Dentition: normal for age Oral mucosa: moist  NECK Symmetric: yes Trachea: midline Thyroid: no palpable nodules in the thyroid bed  CHEST Respiratory effort: normal Retraction or accessory muscle use: no Breath sounds: normal bilaterally Rales, rhonchi, wheeze: none  CARDIOVASCULAR Auscultation: regular rhythm, normal rate Murmurs: none Pulses: carotid and radial pulse 2+ palpable Lower extremity edema: none Lower extremity varicosities: none  MUSCULOSKELETAL Station and gait: normal Digits and nails: no clubbing or cyanosis Muscle strength: grossly normal all extremities Range of motion: grossly normal all extremities Deformity: none  LYMPHATIC Cervical: none palpable Supraclavicular: none palpable  PSYCHIATRIC Oriented to person, place, and time: yes Mood and affect: normal for situation Judgment and insight:  appropriate for situation    Assessment & Plan  PRIMARY HYPERPARATHYROIDISM (E21.0)  Pt Education - Pamphlet Given - The Parathyroid Surgery Book: discussed with patient and provided information.  Follow Up - Call [facility] office after tests / studies doneto discuss further plans  Patient presents today on referral from her endocrinologist for evaluation for suspected primary hyperparathyroidism. She is provided with written literature on parathyroid disease to review at home.  Patient has biochemical evidence of primary hyperparathyroidism. She has had no significant complications. I would like to proceed with imaging studies to include a nuclear medicine parathyroid scan (sestamibi scan) and an ultrasound examination of the neck. We will order these 2 studies. When the results are available, we will contact the patient. If they localize evidence of a parathyroid adenoma, then I think the patient will be a good candidate for outpatient surgery. If these studies failed to identify a parathyroid adenoma, then we will order a 4D CT scan of the neck in hopes of finding the adenoma and making her a candidate for minimally invasive surgery.  Patient understands and wishes to proceed with further studies. We will make arrangements and contact the patient when the results are available.  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  Plan minimally invasive parathyroidectomy.  The risks and benefits of the procedure have been discussed at length with the patient.  The patient understands the proposed procedure, potential alternative treatments, and the course of recovery to be expected.  All of the patient's questions have been answered at this time.  The patient wishes to proceed with surgery.   Armandina Gemma, [place] [facility] Office: [phone]

## 2018-09-16 ENCOUNTER — Other Ambulatory Visit (HOSPITAL_COMMUNITY)
Admission: RE | Admit: 2018-09-16 | Discharge: 2018-09-16 | Disposition: A | Discharge status: Home / Self Care | Source: Ambulatory Visit | Attending: Surgery | Admitting: Surgery

## 2018-09-16 ENCOUNTER — Other Ambulatory Visit: Payer: Self-pay

## 2018-09-16 ENCOUNTER — Encounter (HOSPITAL_COMMUNITY): Payer: Self-pay

## 2018-09-16 ENCOUNTER — Encounter (HOSPITAL_COMMUNITY)
Admission: RE | Admit: 2018-09-16 | Discharge: 2018-09-16 | Disposition: A | Discharge status: Home / Self Care | Source: Ambulatory Visit | Attending: Surgery | Admitting: Surgery

## 2018-09-16 DIAGNOSIS — Z01818 Encounter for other preprocedural examination: Secondary | ICD-10-CM | POA: Insufficient documentation

## 2018-09-16 DIAGNOSIS — R9431 Abnormal electrocardiogram [ECG] [EKG]: Secondary | ICD-10-CM | POA: Diagnosis not present

## 2018-09-16 DIAGNOSIS — Z20828 Contact with and (suspected) exposure to other viral communicable diseases: Secondary | ICD-10-CM | POA: Diagnosis not present

## 2018-09-16 DIAGNOSIS — E210 Primary hyperparathyroidism: Secondary | ICD-10-CM | POA: Diagnosis not present

## 2018-09-16 HISTORY — DX: Presence of external hearing-aid: Z97.4

## 2018-09-16 HISTORY — DX: Hyperparathyroidism, unspecified: E21.3

## 2018-09-16 HISTORY — DX: Unspecified osteoarthritis, unspecified site: M19.90

## 2018-09-16 HISTORY — DX: Heartburn: R12

## 2018-09-16 LAB — BASIC METABOLIC PANEL
Anion gap: 8 (ref 5–15)
BUN: 19 mg/dL (ref 6–20)
CO2: 24 mmol/L (ref 22–32)
Calcium: 10.5 mg/dL — ABNORMAL HIGH (ref 8.9–10.3)
Chloride: 110 mmol/L (ref 98–111)
Creatinine, Ser: 0.69 mg/dL (ref 0.44–1.00)
GFR calc Af Amer: >60 mL/min (ref >60)
GFR calc non Af Amer: >60 mL/min (ref >60)
Glucose, Bld: 97 mg/dL (ref 70–99)
Potassium: 4.8 mmol/L (ref 3.5–5.1)
Sodium: 142 mmol/L (ref 135–145)

## 2018-09-16 LAB — CBC
HCT: 49.4 % — ABNORMAL HIGH (ref 36.0–46.0)
Hemoglobin: 16 g/dL — ABNORMAL HIGH (ref 12.0–15.0)
MCH: 30 pg (ref 26.0–34.0)
MCHC: 32.4 g/dL (ref 30.0–36.0)
MCV: 92.5 fL (ref 80.0–100.0)
Platelets: 186 10*3/uL (ref 150–400)
RBC: 5.34 MIL/uL — ABNORMAL HIGH (ref 3.87–5.11)
RDW: 13.8 % (ref 11.5–15.5)
WBC: 5.3 10*3/uL (ref 4.0–10.5)
nRBC: 0 % (ref 0.0–0.2)

## 2018-09-16 LAB — SARS CORONAVIRUS 2 (TAT 6-24 HRS): SARS Coronavirus 2: NEGATIVE

## 2018-09-16 NOTE — Progress Notes (Signed)
SPOKE W/  Aarica     SCREENING SYMPTOMS OF COVID 19:   COUGH--NO  RUNNY NOSE--- NO  SORE THROAT---NO  NASAL CONGESTION----NO  SNEEZING----NO  SHORTNESS OF BREATH---NO  DIFFICULTY BREATHING---NO  TEMP >100.0 -----NO  UNEXPLAINED BODY ACHES------NO  CHILLS -------- NO  HEADACHES ---------NO  LOSS OF SMELL/ TASTE --------NO    HAVE YOU OR ANY FAMILY MEMBER TRAVELLED PAST 14 DAYS OUT OF THE   COUNTY---NO STATE----NO COUNTRY----NO  HAVE YOU OR ANY FAMILY MEMBER BEEN EXPOSED TO ANYONE WITH COVID 19? SON tested positive 09/12/2018

## 2018-09-18 NOTE — Anesthesia Preprocedure Evaluation (Addendum)
Anesthesia Evaluation  Patient identified by MRN, date of birth, ID band Patient awake    Reviewed: Allergy & Precautions, NPO status , Patient's Chart, lab work & pertinent test results  Airway Mallampati: I       Dental no notable dental hx. (+) Teeth Intact   Pulmonary neg pulmonary ROS,    Pulmonary exam normal breath sounds clear to auscultation       Cardiovascular hypertension, Pt. on medications Normal cardiovascular exam Rhythm:Regular Rate:Normal     Neuro/Psych negative neurological ROS  negative psych ROS   GI/Hepatic negative GI ROS, Neg liver ROS,   Endo/Other  negative endocrine ROS  Renal/GU negative Renal ROS  negative genitourinary   Musculoskeletal   Abdominal Normal abdominal exam  (+)   Peds  Hematology   Anesthesia Other Findings   Reproductive/Obstetrics                            Anesthesia Physical Anesthesia Plan  ASA: II  Anesthesia Plan: General   Post-op Pain Management:    Induction: Intravenous  PONV Risk Score and Plan: 4 or greater and Ondansetron, Dexamethasone and Midazolam  Airway Management Planned: Oral ETT  Additional Equipment: None  Intra-op Plan:   Post-operative Plan: Extubation in OR  Informed Consent: I have reviewed the patients History and Physical, chart, labs and discussed the procedure including the risks, benefits and alternatives for the proposed anesthesia with the patient or authorized representative who has indicated his/her understanding and acceptance.     Dental advisory given  Plan Discussed with: CRNA  Anesthesia Plan Comments:        Anesthesia Quick Evaluation

## 2018-09-19 ENCOUNTER — Encounter (HOSPITAL_COMMUNITY)
Admission: RE | Disposition: A | Discharge status: Home / Self Care | Payer: Self-pay | Source: Home / Self Care | Attending: Surgery

## 2018-09-19 ENCOUNTER — Encounter (HOSPITAL_COMMUNITY): Payer: Self-pay

## 2018-09-19 ENCOUNTER — Ambulatory Visit (HOSPITAL_COMMUNITY): Admitting: Certified Registered"

## 2018-09-19 ENCOUNTER — Ambulatory Visit (HOSPITAL_COMMUNITY)
Admission: RE | Admit: 2018-09-19 | Discharge: 2018-09-19 | Disposition: A | Discharge status: Home / Self Care | Attending: Surgery | Admitting: Surgery

## 2018-09-19 ENCOUNTER — Ambulatory Visit (HOSPITAL_COMMUNITY): Admitting: Physician Assistant

## 2018-09-19 DIAGNOSIS — E210 Primary hyperparathyroidism: Secondary | ICD-10-CM | POA: Diagnosis present

## 2018-09-19 DIAGNOSIS — Z882 Allergy status to sulfonamides status: Secondary | ICD-10-CM | POA: Diagnosis not present

## 2018-09-19 DIAGNOSIS — Z8261 Family history of arthritis: Secondary | ICD-10-CM | POA: Diagnosis not present

## 2018-09-19 DIAGNOSIS — Z86010 Personal history of colonic polyps: Secondary | ICD-10-CM | POA: Diagnosis not present

## 2018-09-19 DIAGNOSIS — Z833 Family history of diabetes mellitus: Secondary | ICD-10-CM | POA: Insufficient documentation

## 2018-09-19 DIAGNOSIS — Z8249 Family history of ischemic heart disease and other diseases of the circulatory system: Secondary | ICD-10-CM | POA: Insufficient documentation

## 2018-09-19 DIAGNOSIS — Z8371 Family history of colonic polyps: Secondary | ICD-10-CM | POA: Diagnosis not present

## 2018-09-19 DIAGNOSIS — I10 Essential (primary) hypertension: Secondary | ICD-10-CM | POA: Diagnosis not present

## 2018-09-19 DIAGNOSIS — M549 Dorsalgia, unspecified: Secondary | ICD-10-CM | POA: Insufficient documentation

## 2018-09-19 DIAGNOSIS — Z888 Allergy status to other drugs, medicaments and biological substances status: Secondary | ICD-10-CM | POA: Insufficient documentation

## 2018-09-19 DIAGNOSIS — Z836 Family history of other diseases of the respiratory system: Secondary | ICD-10-CM | POA: Insufficient documentation

## 2018-09-19 DIAGNOSIS — D351 Benign neoplasm of parathyroid gland: Secondary | ICD-10-CM | POA: Insufficient documentation

## 2018-09-19 DIAGNOSIS — Z841 Family history of disorders of kidney and ureter: Secondary | ICD-10-CM | POA: Insufficient documentation

## 2018-09-19 HISTORY — PX: PARATHYROIDECTOMY: SHX19

## 2018-09-19 SURGERY — PARATHYROIDECTOMY
Anesthesia: General | Site: Neck | Laterality: Right

## 2018-09-19 MED ORDER — CHLORHEXIDINE GLUCONATE CLOTH 2 % EX PADS: 6.0000 | MEDICATED_PAD | Freq: Once | CUTANEOUS | Status: DC

## 2018-09-19 MED ORDER — ROCURONIUM BROMIDE 10 MG/ML (PF) SYRINGE
PREFILLED_SYRINGE | INTRAVENOUS | Status: DC | PRN
  Administered 2018-09-19: 60 mg via INTRAVENOUS

## 2018-09-19 MED ORDER — TRAMADOL HCL 50 MG PO TABS: 50.0000 mg | ORAL_TABLET | Freq: Four times a day (QID) | ORAL | 0 refills | Status: DC | PRN

## 2018-09-19 MED ORDER — PROPOFOL 10 MG/ML IV BOLUS
INTRAVENOUS | Status: DC | PRN
  Administered 2018-09-19: 200 mg via INTRAVENOUS

## 2018-09-19 MED ORDER — MEPERIDINE HCL 50 MG/ML IJ SOLN: 6.2500 mg | INTRAMUSCULAR | Status: DC | PRN

## 2018-09-19 MED ORDER — LACTATED RINGERS IV SOLN
INTRAVENOUS | Status: DC
  Administered 2018-09-19: 06:00:00 via INTRAVENOUS

## 2018-09-19 MED ORDER — DEXAMETHASONE SODIUM PHOSPHATE 10 MG/ML IJ SOLN
INTRAMUSCULAR | Status: DC | PRN
  Administered 2018-09-19: 8 mg via INTRAVENOUS

## 2018-09-19 MED ORDER — BUPIVACAINE HCL (PF) 0.25 % IJ SOLN
INTRAMUSCULAR | Status: DC | PRN
  Administered 2018-09-19: 10 mL

## 2018-09-19 MED ORDER — FENTANYL CITRATE (PF) 100 MCG/2ML IJ SOLN
INTRAMUSCULAR | Status: AC
  Filled 2018-09-19: qty 2

## 2018-09-19 MED ORDER — DEXAMETHASONE SODIUM PHOSPHATE 10 MG/ML IJ SOLN
INTRAMUSCULAR | Status: AC
  Filled 2018-09-19: qty 1

## 2018-09-19 MED ORDER — KETOROLAC TROMETHAMINE 30 MG/ML IJ SOLN: 30.0000 mg | Freq: Once | INTRAMUSCULAR | Status: DC | PRN

## 2018-09-19 MED ORDER — LIDOCAINE 2% (20 MG/ML) 5 ML SYRINGE
INTRAMUSCULAR | Status: AC
  Filled 2018-09-19: qty 5

## 2018-09-19 MED ORDER — HYDROMORPHONE HCL 1 MG/ML IJ SOLN
INTRAMUSCULAR | Status: AC
  Filled 2018-09-19: qty 1

## 2018-09-19 MED ORDER — BUPIVACAINE HCL (PF) 0.25 % IJ SOLN
INTRAMUSCULAR | Status: AC
  Filled 2018-09-19: qty 30

## 2018-09-19 MED ORDER — SUGAMMADEX SODIUM 200 MG/2ML IV SOLN
INTRAVENOUS | Status: DC | PRN
  Administered 2018-09-19: 160 mg via INTRAVENOUS

## 2018-09-19 MED ORDER — MIDAZOLAM HCL 2 MG/2ML IJ SOLN
INTRAMUSCULAR | Status: DC | PRN
  Administered 2018-09-19: 2 mg via INTRAVENOUS

## 2018-09-19 MED ORDER — PROMETHAZINE HCL 25 MG/ML IJ SOLN: 6.2500 mg | INTRAMUSCULAR | Status: DC | PRN

## 2018-09-19 MED ORDER — 0.9 % SODIUM CHLORIDE (POUR BTL) OPTIME
TOPICAL | Status: DC | PRN
  Administered 2018-09-19: 08:00:00 1000 mL

## 2018-09-19 MED ORDER — SUGAMMADEX SODIUM 200 MG/2ML IV SOLN
INTRAVENOUS | Status: AC
  Filled 2018-09-19: qty 2

## 2018-09-19 MED ORDER — LIDOCAINE 2% (20 MG/ML) 5 ML SYRINGE
INTRAMUSCULAR | Status: DC | PRN
  Administered 2018-09-19: 60 mg via INTRAVENOUS

## 2018-09-19 MED ORDER — FENTANYL CITRATE (PF) 250 MCG/5ML IJ SOLN
INTRAMUSCULAR | Status: DC | PRN
  Administered 2018-09-19: 50 ug via INTRAVENOUS
  Administered 2018-09-19: 100 ug via INTRAVENOUS
  Administered 2018-09-19: 25 ug via INTRAVENOUS

## 2018-09-19 MED ORDER — PROPOFOL 10 MG/ML IV BOLUS
INTRAVENOUS | Status: AC
  Filled 2018-09-19: qty 40

## 2018-09-19 MED ORDER — ONDANSETRON HCL 4 MG/2ML IJ SOLN
INTRAMUSCULAR | Status: DC | PRN
  Administered 2018-09-19: 4 mg via INTRAVENOUS

## 2018-09-19 MED ORDER — HYDROMORPHONE HCL 1 MG/ML IJ SOLN
0.2500 mg | INTRAMUSCULAR | Status: DC | PRN
  Administered 2018-09-19 (×2): 0.5 mg via INTRAVENOUS

## 2018-09-19 MED ORDER — ROCURONIUM BROMIDE 10 MG/ML (PF) SYRINGE
PREFILLED_SYRINGE | INTRAVENOUS | Status: AC
  Filled 2018-09-19: qty 10

## 2018-09-19 MED ORDER — MIDAZOLAM HCL 2 MG/2ML IJ SOLN
INTRAMUSCULAR | Status: AC
  Filled 2018-09-19: qty 2

## 2018-09-19 MED ORDER — ONDANSETRON HCL 4 MG/2ML IJ SOLN
INTRAMUSCULAR | Status: AC
  Filled 2018-09-19: qty 2

## 2018-09-19 MED ORDER — CEFAZOLIN SODIUM-DEXTROSE 2-4 GM/100ML-% IV SOLN
2.0000 g | INTRAVENOUS | Status: AC
  Administered 2018-09-19: 08:00:00 2 g via INTRAVENOUS
  Filled 2018-09-19: qty 100

## 2018-09-19 SURGICAL SUPPLY — 30 items
ATTRACTOMAT 16X20 MAGNETIC DRP (DRAPES) ×2 IMPLANT
BLADE SURG 15 STRL LF DISP TIS (BLADE) ×1 IMPLANT
BLADE SURG 15 STRL SS (BLADE) ×1
CHLORAPREP W/TINT 26 (MISCELLANEOUS) ×2 IMPLANT
CLIP VESOCCLUDE MED 6/CT (CLIP) ×4 IMPLANT
CLIP VESOCCLUDE SM WIDE 6/CT (CLIP) ×4 IMPLANT
COVER SURGICAL LIGHT HANDLE (MISCELLANEOUS) ×2 IMPLANT
COVER WAND RF STERILE (DRAPES) ×2 IMPLANT
DERMABOND ADVANCED (GAUZE/BANDAGES/DRESSINGS) ×1
DERMABOND ADVANCED .7 DNX12 (GAUZE/BANDAGES/DRESSINGS) IMPLANT
DRAPE LAPAROTOMY T 98X78 PEDS (DRAPES) ×2 IMPLANT
ELECT PENCIL ROCKER SW 15FT (MISCELLANEOUS) ×2 IMPLANT
ELECT REM PT RETURN 15FT ADLT (MISCELLANEOUS) ×2 IMPLANT
GAUZE 4X4 16PLY RFD (DISPOSABLE) ×2 IMPLANT
GLOVE SURG ORTHO 8.0 STRL STRW (GLOVE) ×2 IMPLANT
GOWN STRL REUS W/TWL XL LVL3 (GOWN DISPOSABLE) ×6 IMPLANT
HEMOSTAT SURGICEL 2X4 FIBR (HEMOSTASIS) IMPLANT
ILLUMINATOR WAVEGUIDE N/F (MISCELLANEOUS) IMPLANT
KIT BASIN OR (CUSTOM PROCEDURE TRAY) ×2 IMPLANT
KIT TURNOVER KIT A (KITS) IMPLANT
NDL HYPO 25X1 1.5 SAFETY (NEEDLE) ×1 IMPLANT
NEEDLE HYPO 25X1 1.5 SAFETY (NEEDLE) ×2 IMPLANT
PACK BASIC VI WITH GOWN DISP (CUSTOM PROCEDURE TRAY) ×2 IMPLANT
SUT MNCRL AB 4-0 PS2 18 (SUTURE) ×2 IMPLANT
SUT VIC AB 3-0 SH 18 (SUTURE) ×2 IMPLANT
SYR BULB IRRIGATION 50ML (SYRINGE) ×2 IMPLANT
SYR CONTROL 10ML LL (SYRINGE) ×2 IMPLANT
TOWEL OR 17X26 10 PK STRL BLUE (TOWEL DISPOSABLE) ×2 IMPLANT
TOWEL OR NON WOVEN STRL DISP B (DISPOSABLE) ×2 IMPLANT
TUBING CONNECTING 10 (TUBING) ×2 IMPLANT

## 2018-09-19 NOTE — Anesthesia Procedure Notes (Signed)
Procedure Name: Intubation Date/Time: 09/19/2018 7:40 AM Performed by: Eben Burow, CRNA Pre-anesthesia Checklist: Patient identified, Emergency Drugs available, Suction available and Patient being monitored Patient Re-evaluated:Patient Re-evaluated prior to induction Oxygen Delivery Method: Circle system utilized Preoxygenation: Pre-oxygenation with 100% oxygen Induction Type: IV induction Ventilation: Mask ventilation without difficulty Laryngoscope Size: Mac and 4 Grade View: Grade I Tube type: Oral Tube size: 7.0 mm Number of attempts: 1 Airway Equipment and Method: Stylet and Oral airway Placement Confirmation: ETT inserted through vocal cords under direct vision,  positive ETCO2 and breath sounds checked- equal and bilateral Secured at: 21 cm Tube secured with: Tape Dental Injury: Teeth and Oropharynx as per pre-operative assessment

## 2018-09-19 NOTE — Op Note (Signed)
OPERATIVE REPORT - PARATHYROIDECTOMY  Preoperative diagnosis: Primary hyperparathyroidism  Postop diagnosis: Same  Procedure: Right minimally invasive parathyroidectomy  Surgeon:  Armandina Gemma, MD  Anesthesia: General endotracheal  Estimated blood loss: Minimal  Preparation: ChloraPrep  Indications: Patient is referred by Dr. Bud Face for surgical evaluation and management of primary hyperparathyroidism. Patient had been noted by her primary care provider to have elevated serum calcium levels in the fall of 2019. Upon looking back in earlier records, the patient had had hypercalcemia for some time. She was referred to endocrinology. Recent testing included a serum calcium level which was elevated at 11.0, intact PTH level which was elevated at 100, and a 24-hour urine collection for calcium which was elevated at 442. Nuclear med parathyroid scan and ultrasound localize an adenoma to the mid posterior right thyroid lobe.  Patient now comes to surgery for parathyroidectomy.  Procedure: The patient was prepared in the pre-operative holding area. The patient was brought to the operating room and placed in a supine position on the operating room table. Following administration of general anesthesia, the patient was positioned and then prepped and draped in the usual strict aseptic fashion. After ascertaining that an adequate level of anesthesia been achieved, a neck incision was made with a #15 blade. Dissection was carried through subcutaneous tissues and platysma. Hemostasis was obtained with the electrocautery. Skin flaps were developed circumferentially and a Weitlander retractor was placed for exposure.  Strap muscles were incised in the midline. Strap muscles were reflected lateralley exposing the thyroid lobe. With gentle blunt dissection the thyroid lobe was mobilized.  Dissection was carried through adipose tissue and an enlarged parathyroid gland was identified. It was gently mobilized.  Vascular structures were divided between small ligaclips. Care was taken to avoid the recurrent laryngeal nerve and the esophagus. The parathyroid gland was completely excised. It was submitted to pathology where frozen section confirmed parathyroid tissue consistent with adenoma.  Neck was irrigated with warm saline and good hemostasis was noted. Fibrillar was placed in the operative field. Strap muscles were approximated in the midline with interrupted 3-0 Vicryl sutures. Platysma was closed with interrupted 3-0 Vicryl sutures. Marcaine was infiltrated circumferentially. Skin was closed with a running 4-0 Monocryl subcuticular suture. Wound was washed and dried and Dermabond was applied. Patient was awakened from anesthesia and brought to the recovery room. The patient tolerated the procedure well.   Armandina Gemma, MD [facility] Office: [phone]

## 2018-09-19 NOTE — Transfer of Care (Signed)
Immediate Anesthesia Transfer of Care Note  Patient: Courtney Hickman  Procedure(s) Performed: RIGHT PARATHYROIDECTOMY (Right Neck)  Patient Location: PACU  Anesthesia Type:General  Level of Consciousness: awake, alert  and oriented  Airway & Oxygen Therapy: Patient Spontanous Breathing and Patient connected to face mask oxygen  Post-op Assessment: Report given to RN and Post -op Vital signs reviewed and stable  Post vital signs: Reviewed and stable  Last Vitals:  Vitals Value Taken Time  BP 113/75 09/19/18 0850  Temp    Pulse 76 09/19/18 0852  Resp 17 09/19/18 0852  SpO2 99 % 09/19/18 0852  Vitals shown include unvalidated device data.  Last Pain:  Vitals:   09/19/18 0551  TempSrc:   PainSc: 0-No pain         Complications: No apparent anesthesia complications

## 2018-09-19 NOTE — Anesthesia Postprocedure Evaluation (Signed)
Anesthesia Post Note  Patient: Courtney Hickman  Procedure(s) Performed: RIGHT PARATHYROIDECTOMY (Right Neck)     Patient location during evaluation: PACU Anesthesia Type: General Level of consciousness: awake Pain management: pain level controlled Vital Signs Assessment: post-procedure vital signs reviewed and stable Respiratory status: spontaneous breathing Cardiovascular status: stable Postop Assessment: no apparent nausea or vomiting Anesthetic complications: no    Last Vitals:  Vitals:   09/19/18 0945 09/19/18 1000  BP: 103/71 106/70  Pulse: 69 70  Resp: 14 14  Temp: 36.7 C   SpO2: 95% 95%    Last Pain:  Vitals:   09/19/18 1000  TempSrc:   PainSc: 4    Pain Goal:                   Huston Foley

## 2018-09-19 NOTE — Interval H&P Note (Signed)
History and Physical Interval Note:  09/19/2018 7:13 AM  Courtney Hickman  has presented today for surgery, with the diagnosis of PRIMARY HYPERPARATHYROIDISM.  The various methods of treatment have been discussed with the patient and family. After consideration of risks, benefits and other options for treatment, the patient has consented to    Procedure(s): RIGHT PARATHYROIDECTOMY (Right) as a surgical intervention.    The patient's history has been reviewed, patient examined, no change in status, stable for surgery.  I have reviewed the patient's chart and labs.  Questions were answered to the patient's satisfaction.    Armandina Gemma, [place] [facility] Office: [place]

## 2018-09-20 ENCOUNTER — Encounter (HOSPITAL_COMMUNITY): Payer: Self-pay | Admitting: Surgery

## 2018-11-06 ENCOUNTER — Other Ambulatory Visit: Payer: Self-pay | Admitting: Family Medicine

## 2018-11-13 LAB — T4, FREE: Free T4: 1.3 ng/dL (ref 0.8–1.8)

## 2018-11-13 LAB — PTH, INTACT AND CALCIUM
Calcium: 9.9 mg/dL (ref 8.6–10.4)
PTH: 37 pg/mL (ref 14–64)

## 2018-11-13 LAB — TSH: TSH: 1.46 mIU/L (ref 0.40–4.50)

## 2018-11-21 ENCOUNTER — Ambulatory Visit (INDEPENDENT_AMBULATORY_CARE_PROVIDER_SITE_OTHER): Admitting: "Endocrinology

## 2018-11-21 ENCOUNTER — Encounter: Payer: Self-pay | Admitting: "Endocrinology

## 2018-11-21 ENCOUNTER — Other Ambulatory Visit: Payer: Self-pay

## 2018-11-21 DIAGNOSIS — E210 Primary hyperparathyroidism: Secondary | ICD-10-CM | POA: Diagnosis not present

## 2018-11-21 NOTE — Progress Notes (Signed)
11/21/2018, 10:49 AM                                 Endocrinology Telehealth Visit Follow up Note -During COVID -19 Pandemic  I connected with Courtney Hickman on 11/21/2018   by telephone and verified that I am speaking with the correct person using two identifiers. Courtney Hickman, Aug 17, 1960. she has verbally consented to this visit. All issues noted in this document were discussed and addressed. The format was not optimal for physical exam.   Subjective:    Patient ID: Courtney Hickman, female    DOB: 10-Jan-1961, PCP Dennard Schaumann Cammie Mcgee, MD   Past Medical History:  Diagnosis Date  . Anemia 08/23/2011   Dr. Benson Norway: EGD, Colonoscopy, Capsule Endo--all negative Had metrorhagia at that time  . Arthritis    fingers  . Hearing impairment   . Heart burn    occ  . Hyperparathyroidism ([place])   . Hypertension   . Wears hearing aid in both ears    Past Surgical History:  Procedure Laterality Date  . CESAREAN SECTION     x2  . COLONOSCOPY    . PARATHYROIDECTOMY Right 09/19/2018   Procedure: RIGHT PARATHYROIDECTOMY;  Surgeon: Armandina Gemma, MD;  Location: [facility];  Service: General;  Laterality: Right;  . WISDOM TOOTH EXTRACTION     Social History   Socioeconomic History  . Marital status: Married    Spouse name: Not on file  . Number of children: Not on file  . Years of education: Not on file  . Highest education level: Not on file  Occupational History  . Not on file  Social Needs  . Financial resource strain: Not on file  . Food insecurity    Worry: Not on file    Inability: Not on file  . Transportation needs    Medical: Not on file    Non-medical: Not on file  Tobacco Use  . Smoking status: Never Smoker  . Smokeless tobacco: Never Used  Substance and Sexual Activity  . Alcohol use: No  . Drug use: No  . Sexual activity: Not on file  Lifestyle  . Physical activity    Days per week: Not on file    Minutes per session: Not  on file  . Stress: Not on file  Relationships  . Social Herbalist on phone: Not on file    Gets together: Not on file    Attends religious service: Not on file    Active member of club or organization: Not on file    Attends meetings of clubs or organizations: Not on file    Relationship status: Not on file  Other Topics Concern  . Not on file  Social History Narrative   Married. HomeMaker.   3 children; 65, 68, 88 y/o   Family History  Problem Relation Age of Onset  . Diabetes Mother    Outpatient Encounter Medications as of 11/21/2018  Medication Sig  . amLODipine (NORVASC) 10 MG tablet Take 1 tablet (10 mg total) by mouth daily.  Marland Kitchen CRANBERRY EXTRACT PO Take 1 capsule by mouth daily.  Marland Kitchen  doxazosin (CARDURA) 2 MG tablet Take 1 tablet by mouth once daily  . losartan (COZAAR) 100 MG tablet Take 1 tablet by mouth once daily (Patient taking differently: Take 100 mg by mouth daily. )  . Potassium 99 MG TABS Take 99 mg by mouth daily.  . traMADol (ULTRAM) 50 MG tablet Take 1-2 tablets (50-100 mg total) by mouth every 6 (six) hours as needed.   No facility-administered encounter medications on file as of 11/21/2018.    ALLERGIES: Allergies  Allergen Reactions  . Actifed Cold-Allergy [Chlorpheniramine-Phenyleph Er] Anaphylaxis    Eyes hurt / headache  . Bactrim [Sulfamethoxazole-Trimethoprim] Rash    VACCINATION STATUS: Immunization History  Administered Date(s) Administered  . Influenza,inj,Quad PF,6+ Mos 10/13/2012, 10/28/2013  . Tdap 09/19/2011    HPI Courtney Hickman is 58 y.o. female who presents today with a medical history as above. she is engaged in telephone visit to follow up.  She was previously seen in consult for primary hyperparathyroidism causing hypercalcemia.  She was sent for parathyroidectomy Dr. Armandina Gemma. She underwent right superior parathyroid resection on September 19, 2018 with resolution of her hypercalcemia and hyperparathyroidism.  She is  recovering very well.  Her surgical pathology showed hypothyroid gland.   -She is not on any thyroid or calcium supplement at this time. She has no new complaints today.  She did not have recent bone density. -  Denies any history of thyrotoxicosis, CKD, sarcoidosis, she denies any complaint of chronic cough.  Review of systems: Limited as above.  Objective:    There were no vitals taken for this visit.  Wt Readings from Last 3 Encounters:  09/19/18 178 lb 4.8 oz (80.9 kg)  09/16/18 178 lb 4.8 oz (80.9 kg)  04/08/18 175 lb (79.4 kg)   CMP     Component Value Date/Time   NA 142 09/16/2018 1036   K 4.8 09/16/2018 1036   CL 110 09/16/2018 1036   CO2 24 09/16/2018 1036   GLUCOSE 97 09/16/2018 1036   BUN 19 09/16/2018 1036   CREATININE 0.69 09/16/2018 1036   CREATININE 0.79 02/03/2018 1254   CALCIUM 9.9 11/12/2018 1355   PROT 6.7 02/03/2018 1254   ALBUMIN 4.0 05/17/2014 1110   AST 11 02/03/2018 1254   ALT 20 02/03/2018 1254   ALKPHOS 45 05/17/2014 1110   BILITOT 0.7 02/03/2018 1254   GFRNONAA >60 09/16/2018 1036   GFRNONAA 84 02/03/2018 1254   GFRAA >60 09/16/2018 1036   GFRAA 97 02/03/2018 1254    Lipid Panel     Component Value Date/Time   CHOL 170 12/05/2017 1201   TRIG 97 12/05/2017 1201   HDL 38 (L) 12/05/2017 1201   CHOLHDL 4.5 12/05/2017 1201   VLDL 36 05/17/2014 1110   LDLCALC 112 (H) 12/05/2017 1201      Lab Results  Component Value Date   TSH 1.46 11/12/2018   TSH 1.55 04/10/2018   TSH 1.57 02/03/2018   TSH 0.79 05/20/2017   TSH 0.917 05/17/2014   FREET4 1.3 11/12/2018   FREET4 1.4 04/10/2018     Results for PONDA, WINDISH (MRN [id number]) as of 11/21/2018 10:20  Ref. Range 11/12/2018 13:55  PTH, Intact Latest Ref Range: 14 - 64 pg/mL 37  TSH Latest Ref Range: 0.40 - 4.50 mIU/L 1.46  T4,Free(Direct) Latest Ref Range: 0.8 - 1.8 ng/dL 1.3    Assessment & Plan:   1. Hypercalcemia  Assessment: 1. Hypercalcemia -resolved 2.  Primary  hyperparathyroidism-status post surgery Plan: -She underwent right  superior parathyroid resection on September 19, 2018. -After surgery PTH is 37, calcium is stable at 9.9. She would not need any further supplements nor intervention at this time.  her surgery is successful.  She is advised to get bone density through her primary care doctor when her next visit.  She will return in 1 year with repeat PTH/calcium, and thyroid function tests.  - I did not initiate any new prescriptions today. - I advised her  to maintain close follow up with Susy Frizzle, MD for primary care needs.    Time for this visit: 15 minutes. Courtney Hickman  participated in the discussions, expressed understanding, and voiced agreement with the above plans.  All questions were answered to her satisfaction. she is encouraged to contact clinic should she have any questions or concerns prior to her return visit.  Follow up plan: Return in about 1 year (around 11/21/2019) for Follow up with Pre-visit Labs.   Glade Lloyd, MD [facility] [address] [place], [place] [address] Phone: [phone]  Fax: [phone]     11/21/2018, 10:49 AM  This note was partially dictated with voice recognition software. Similar sounding words can be transcribed inadequately or may not  be corrected upon review.

## 2018-12-22 ENCOUNTER — Other Ambulatory Visit: Payer: Self-pay | Admitting: Family Medicine

## 2019-02-05 ENCOUNTER — Ambulatory Visit (INDEPENDENT_AMBULATORY_CARE_PROVIDER_SITE_OTHER): Admitting: Family Medicine

## 2019-02-05 ENCOUNTER — Encounter: Payer: Self-pay | Admitting: Family Medicine

## 2019-02-05 ENCOUNTER — Other Ambulatory Visit: Payer: Self-pay

## 2019-02-05 VITALS — BP 150/86 | HR 88 | Temp 96.6°F | Resp 14 | Ht 62.0 in | Wt 188.0 lb

## 2019-02-05 DIAGNOSIS — R0789 Other chest pain: Secondary | ICD-10-CM

## 2019-02-05 MED ORDER — DOXAZOSIN MESYLATE 2 MG PO TABS: 2.0000 mg | ORAL_TABLET | Freq: Every day | ORAL | 0 refills | Status: DC

## 2019-02-05 MED ORDER — METOPROLOL SUCCINATE ER 25 MG PO TB24: 25.0000 mg | ORAL_TABLET | Freq: Every day | ORAL | 3 refills | Status: DC

## 2019-02-05 NOTE — Progress Notes (Signed)
Subjective:    Patient ID: Courtney Hickman, female    DOB: May 19, 1960, 59 y.o.   MRN: [id number]  HPI  Patient presents with a 1 week history of atypical chest pain.  Patient reports episodes of sharp twinges of pain in the substernal area as well as in her left chest and occasionally even in her right chest.  This seems to be unprovoked.  It can occur with activity however it is also occurring occasionally at rest.  The pain lasts seconds and then resolve spontaneously.  Associated with the pain, she does report increased frequency of palpitations and also some subjective shortness of breath.  She denies any obvious angina.  She denies any nausea or vomiting.  She denies any pressure-like sensation in the chest.  She denies any cough or fever or pleurisy.  I am unable to reproduce the pain with palpation of the chest wall or pressure on sternum.  She denies any association of the pain with food.  Risk factors include age, hypertension, brother had heart attack and died at age 54. Past Medical History:  Diagnosis Date  . Anemia 08/23/2011   Dr. Benson Norway: EGD, Colonoscopy, Capsule Endo--all negative Had metrorhagia at that time  . Arthritis    fingers  . Hearing impairment   . Heart burn    occ  . Hyperparathyroidism ([place])   . Hypertension   . Wears hearing aid in both ears    Patient is hearing impaired.  This is been since birth.  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 Past Surgical History:  Procedure Laterality Date  . CESAREAN SECTION     x2  . COLONOSCOPY    . PARATHYROIDECTOMY Right 09/19/2018   Procedure: RIGHT PARATHYROIDECTOMY;  Surgeon: Armandina Gemma, MD;  Location: [facility];  Service: General;  Laterality: Right;  . WISDOM TOOTH EXTRACTION     Current Outpatient Medications on File Prior to Visit  Medication Sig Dispense Refill  . amLODipine (NORVASC) 10 MG tablet Take 1  tablet by mouth once daily 90 tablet 0  . CRANBERRY EXTRACT PO Take 1 capsule by mouth daily.    Marland Kitchen doxazosin (CARDURA) 2 MG tablet Take 1 tablet by mouth once daily 90 tablet 0  . losartan (COZAAR) 100 MG tablet Take 1 tablet by mouth once daily (Patient taking differently: Take 100 mg by mouth daily. ) 90 tablet 3  . Potassium 99 MG TABS Take 99 mg by mouth daily.    . traMADol (ULTRAM) 50 MG tablet Take 1-2 tablets (50-100 mg total) by mouth every 6 (six) hours as needed. 15 tablet 0   No current facility-administered medications on file prior to visit.   Allergies  Allergen Reactions  . Actifed Cold-Allergy [Chlorpheniramine-Phenyleph Er] Anaphylaxis    Eyes hurt / headache  . Bactrim [Sulfamethoxazole-Trimethoprim] Rash   Social History   Socioeconomic History  . Marital status: Married    Spouse name: Not on file  . Number of children: Not on file  . Years of education: Not on file  . Highest education level: Not on file  Occupational History  . Not on file  Tobacco Use  . Smoking status: Never Smoker  . Smokeless tobacco: Never Used  Substance and Sexual Activity  . Alcohol use: No  . Drug use: No  . Sexual activity: Not on file  Other Topics Concern  .  Not on file  Social History Narrative   Married. HomeMaker.   3 children; 61, 82, 35 y/o   Social Determinants of Radio broadcast assistant Strain:   . Difficulty of Paying Living Expenses: Not on file  Food Insecurity:   . Worried About Charity fundraiser in the Last Year: Not on file  . Ran Out of Food in the Last Year: Not on file  Transportation Needs:   . Lack of Transportation (Medical): Not on file  . Lack of Transportation (Non-Medical): Not on file  Physical Activity:   . Days of Exercise per Week: Not on file  . Minutes of Exercise per Session: Not on file  Stress:   . Feeling of Stress : Not on file  Social Connections:   . Frequency of Communication with Friends and Family: Not on file  .  Frequency of Social Gatherings with Friends and Family: Not on file  . Attends Religious Services: Not on file  . Active Member of Clubs or Organizations: Not on file  . Attends Archivist Meetings: Not on file  . Marital Status: Not on file  Intimate Partner Violence:   . Fear of Current or Ex-Partner: Not on file  . Emotionally Abused: Not on file  . Physically Abused: Not on file  . Sexually Abused: Not on file      Review of Systems  All other systems reviewed and are negative.      Objective:   Physical Exam Vitals reviewed.  Constitutional:      Appearance: Normal appearance.  Cardiovascular:     Rate and Rhythm: Normal rate and regular rhythm.     Heart sounds: Normal heart sounds. No murmur.  Pulmonary:     Effort: Pulmonary effort is normal. No respiratory distress.     Breath sounds: Normal breath sounds. No stridor. No wheezing, rhonchi or rales.  Abdominal:     General: Bowel sounds are normal.     Palpations: Abdomen is soft.  Musculoskeletal:     Cervical back: Normal range of motion.  Neurological:     Mental Status: She is alert.           Assessment & Plan:  Other chest pain - Plan: EKG 12-Lead, COMPLETE METABOLIC PANEL WITH GFR, Lipid Panel, CBC with Differential, Ambulatory referral to Cardiology, Troponin I  Chest pain is atypical.  Blood pressure is elevated today and I will add Toprol-XL 25 mg daily to help lower both her blood pressure and hopefully reduce the palpitations.  EKG today shows nonspecific ST changes in the inferior leads as well as poor R wave progression but no obvious evidence of ischemia or infarction.  I will check a CBC, CMP, fasting lipid panel along with a troponin.  Schedule patient for a cardiology consultation for an outpatient stress test.  Start aspirin 81 mg daily.  If the pain becomes intense or severe, she is directed to go to the emergency room immediately or call 911.

## 2019-02-06 ENCOUNTER — Other Ambulatory Visit

## 2019-02-07 LAB — COMPLETE METABOLIC PANEL WITH GFR
AG Ratio: 1.8 (calc) (ref 1.0–2.5)
ALT: 18 U/L (ref 6–29)
AST: 12 U/L (ref 10–35)
Albumin: 4.4 g/dL (ref 3.6–5.1)
Alkaline phosphatase (APISO): 64 U/L (ref 37–153)
BUN: 18 mg/dL (ref 7–25)
CO2: 22 mmol/L (ref 20–32)
Calcium: 9.5 mg/dL (ref 8.6–10.4)
Chloride: 107 mmol/L (ref 98–110)
Creat: 0.74 mg/dL (ref 0.50–1.05)
GFR, Est African American: 104 mL/min/{1.73_m2} (ref >=60)
GFR, Est Non African American: 90 mL/min/{1.73_m2} (ref >=60)
Globulin: 2.4 g/dL (calc) (ref 1.9–3.7)
Glucose, Bld: 88 mg/dL (ref 65–99)
Potassium: 4.3 mmol/L (ref 3.5–5.3)
Sodium: 140 mmol/L (ref 135–146)
Total Bilirubin: 0.6 mg/dL (ref 0.2–1.2)
Total Protein: 6.8 g/dL (ref 6.1–8.1)

## 2019-02-07 LAB — CBC WITH DIFFERENTIAL/PLATELET
Absolute Monocytes: 450 cells/uL (ref 200–950)
Basophils Absolute: 42 cells/uL (ref 0–200)
Basophils Relative: 0.7 %
Eosinophils Absolute: 168 cells/uL (ref 15–500)
Eosinophils Relative: 2.8 %
HCT: 44.6 % (ref 35.0–45.0)
Hemoglobin: 15.3 g/dL (ref 11.7–15.5)
Lymphs Abs: 1368 cells/uL (ref 850–3900)
MCH: 30.7 pg (ref 27.0–33.0)
MCHC: 34.3 g/dL (ref 32.0–36.0)
MCV: 89.6 fL (ref 80.0–100.0)
MPV: 11.2 fL (ref 7.5–12.5)
Monocytes Relative: 7.5 %
Neutro Abs: 3972 cells/uL (ref 1500–7800)
Neutrophils Relative %: 66.2 %
Platelets: 203 10*3/uL (ref 140–400)
RBC: 4.98 10*6/uL (ref 3.80–5.10)
RDW: 13.2 % (ref 11.0–15.0)
Total Lymphocyte: 22.8 %
WBC: 6 10*3/uL (ref 3.8–10.8)

## 2019-02-07 LAB — LIPID PANEL
Cholesterol: 175 mg/dL (ref <200)
HDL: 43 mg/dL — ABNORMAL LOW (ref >=50)
LDL Cholesterol (Calc): 116 mg/dL (calc) — ABNORMAL HIGH
Non-HDL Cholesterol (Calc): 132 mg/dL (calc) — ABNORMAL HIGH (ref <130)
Total CHOL/HDL Ratio: 4.1 (calc) (ref <5.0)
Triglycerides: 65 mg/dL (ref <150)

## 2019-02-07 LAB — TROPONIN I: Troponin I: <0.01 ng/mL (ref <=0.0)

## 2019-02-10 MED ORDER — ATORVASTATIN CALCIUM 20 MG PO TABS: 20.0000 mg | ORAL_TABLET | Freq: Every day | ORAL | 1 refills | Status: DC

## 2019-02-11 ENCOUNTER — Ambulatory Visit: Attending: Internal Medicine

## 2019-02-11 ENCOUNTER — Other Ambulatory Visit: Payer: Self-pay

## 2019-02-11 DIAGNOSIS — Z20822 Contact with and (suspected) exposure to covid-19: Secondary | ICD-10-CM

## 2019-02-12 LAB — NOVEL CORONAVIRUS, NAA: SARS-CoV-2, NAA: DETECTED — AB

## 2019-02-13 ENCOUNTER — Telehealth: Payer: Self-pay | Admitting: Adult Health

## 2019-02-13 ENCOUNTER — Telehealth: Payer: Self-pay | Admitting: Pulmonary Disease

## 2019-02-13 NOTE — Telephone Encounter (Signed)
Called and spoke with patient about her covid positive test, and verified that she read the message from Wyn Quaker, NP about monoclonal therapy for patients at increased risk for complications on my chart.  Shilynn noted that she did review the information that [place] sent, and declines discussion on her eligibility for treatment.  She notes she is improving.    Patient voiced appreciation for call.  Wilber Bihari, NP

## 2019-02-13 NOTE — Telephone Encounter (Signed)
02/13/2019    I attempted to reach the patient regarding recent Covid diagnosis as well as opportunity for monoclonal antibody infusion.  It does look like the patient would qualify based off of Age and HTN history.  I have left a message with the telephone number: [phone] for the patient to call us back if they are interested.  I will also send a MyChart message detailing this as well.   Will route to PCP as FYI.  Lauraine Rinne, NP

## 2019-03-20 ENCOUNTER — Encounter: Payer: Self-pay | Admitting: Cardiology

## 2019-03-20 ENCOUNTER — Ambulatory Visit (INDEPENDENT_AMBULATORY_CARE_PROVIDER_SITE_OTHER): Payer: Self-pay | Admitting: Cardiology

## 2019-03-20 VITALS — BP 137/77 | HR 80 | Temp 98.3°F | Ht 61.0 in | Wt 191.0 lb

## 2019-03-20 DIAGNOSIS — R0789 Other chest pain: Secondary | ICD-10-CM

## 2019-03-20 DIAGNOSIS — I10 Essential (primary) hypertension: Secondary | ICD-10-CM

## 2019-03-20 DIAGNOSIS — R002 Palpitations: Secondary | ICD-10-CM

## 2019-03-20 NOTE — Progress Notes (Signed)
Clinical Summary Ms. Haydel is a 59 y.o.female seen as new consult, referred by Dr Dennard Schaumann for the following medical problems.   1. Chest pain - symptoms started about 1.5 months ago. Nonspecific discomfort, midchest but could be left sided or right sided. 4/10 in severity. Would occur at rest or with activity. No SOB. Could have some mild palpitaitons. Worst with using upper extremities. Lasts a few seconds. WOuld happen off and on - no significant recurrence  - sedentary lifestyle over the last few weeks. No exertional symptoms  CAD risk factors: HTN, brother MI age mid 67s, HL  2. Palpitions - symptoms are sporadic - symptoms better with toprol started by pcp - 1 cup of coffee in AM, no sodas, no teas  3. HTN - compliant with meds  Past Medical History:  Diagnosis Date  . Anemia 08/23/2011   Dr. Benson Norway: EGD, Colonoscopy, Capsule Endo--all negative Had metrorhagia at that time  . Arthritis    fingers  . Hearing impairment   . Heart burn    occ  . Hyperparathyroidism ([place])   . Hypertension   . Wears hearing aid in both ears      Allergies  Allergen Reactions  . Actifed Cold-Allergy [Chlorpheniramine-Phenyleph Er] Anaphylaxis    Eyes hurt / headache  . Bactrim [Sulfamethoxazole-Trimethoprim] Rash     Current Outpatient Medications  Medication Sig Dispense Refill  . amLODipine (NORVASC) 10 MG tablet Take 1 tablet by mouth once daily 90 tablet 0  . atorvastatin (LIPITOR) 20 MG tablet Take 1 tablet (20 mg total) by mouth daily. 90 tablet 1  . CRANBERRY EXTRACT PO Take 1 capsule by mouth daily.    Marland Kitchen doxazosin (CARDURA) 2 MG tablet Take 1 tablet (2 mg total) by mouth daily. 90 tablet 0  . losartan (COZAAR) 100 MG tablet Take 1 tablet by mouth once daily (Patient taking differently: Take 100 mg by mouth daily. ) 90 tablet 3  . metoprolol succinate (TOPROL-XL) 25 MG 24 hr tablet Take 1 tablet (25 mg total) by mouth daily. 90 tablet 3  . Potassium 99 MG TABS Take 99 mg  by mouth daily.     No current facility-administered medications for this visit.     Past Surgical History:  Procedure Laterality Date  . CESAREAN SECTION     x2  . COLONOSCOPY    . PARATHYROIDECTOMY Right 09/19/2018   Procedure: RIGHT PARATHYROIDECTOMY;  Surgeon: Armandina Gemma, MD;  Location: [facility];  Service: General;  Laterality: Right;  . WISDOM TOOTH EXTRACTION       Allergies  Allergen Reactions  . Actifed Cold-Allergy [Chlorpheniramine-Phenyleph Er] Anaphylaxis    Eyes hurt / headache  . Bactrim [Sulfamethoxazole-Trimethoprim] Rash      Family History  Problem Relation Age of Onset  . Diabetes Mother      Social History Ms. Bride reports that she has never smoked. She has never used smokeless tobacco. Ms. Lebeda reports no history of alcohol use.   Review of Systems CONSTITUTIONAL: No weight loss, fever, chills, weakness or fatigue.  HEENT: Eyes: No visual loss, blurred vision, double vision or yellow sclerae.No hearing loss, sneezing, congestion, runny nose or sore throat.  SKIN: No rash or itching.  CARDIOVASCULAR: per hpi RESPIRATORY: No shortness of breath, cough or sputum.  GASTROINTESTINAL: No anorexia, nausea, vomiting or diarrhea. No abdominal pain or blood.  GENITOURINARY: No burning on urination, no polyuria NEUROLOGICAL: No headache, dizziness, syncope, paralysis, ataxia, numbness or tingling in the extremities. No  change in bowel or bladder control.  MUSCULOSKELETAL: No muscle, back pain, joint pain or stiffness.  LYMPHATICS: No enlarged nodes. No history of splenectomy.  PSYCHIATRIC: No history of depression or anxiety.  ENDOCRINOLOGIC: No reports of sweating, cold or heat intolerance. No polyuria or polydipsia.  Marland Kitchen   Physical Examination Today's Vitals   03/20/19 1310 03/20/19 1317  BP: (!) 141/75 137/77  Pulse: 80   Temp: 98.3 F (36.8 C)   TempSrc: Temporal   SpO2: 98%   Weight: 191 lb (86.6 kg)   Height: 5\' 1"  (1.549 m)    Body  mass index is 36.09 kg/m.  Gen: resting comfortably, no acute distress HEENT: no scleral icterus, pupils equal round and reactive, no palptable cervical adenopathy,  CV: RRR no m/r/g, no jvd Resp: Clear to auscultation bilaterally GI: abdomen is soft, non-tender, non-distended, normal bowel sounds, no hepatosplenomegaly MSK: extremities are warm, no edema.  Skin: warm, no rash Neuro:  no focal deficits Psych: appropriate affect     Assessment and Plan  1. Chest pain - atypical symptoms. Would monitor at this time, do not plan on ischemic testing at this time  2. Palpitations - mild, improved with beta blocker - if progression would consider home monitor  3. HTN - manual recheck 130/75, at goal, continue current meds    F/u 6 months   Arnoldo Lenis, M.D.,

## 2019-03-20 NOTE — Patient Instructions (Signed)

## 2019-03-28 ENCOUNTER — Other Ambulatory Visit: Payer: Self-pay | Admitting: Family Medicine

## 2019-05-03 ENCOUNTER — Other Ambulatory Visit: Payer: Self-pay | Admitting: Family Medicine

## 2019-06-06 ENCOUNTER — Other Ambulatory Visit: Payer: Self-pay | Admitting: Family Medicine

## 2019-06-06 DIAGNOSIS — I10 Essential (primary) hypertension: Secondary | ICD-10-CM

## 2019-06-28 ENCOUNTER — Other Ambulatory Visit: Payer: Self-pay | Admitting: Family Medicine

## 2019-08-14 ENCOUNTER — Other Ambulatory Visit: Payer: Self-pay | Admitting: Family Medicine

## 2019-09-06 ENCOUNTER — Other Ambulatory Visit: Payer: Self-pay | Admitting: Family Medicine

## 2019-09-06 DIAGNOSIS — I10 Essential (primary) hypertension: Secondary | ICD-10-CM

## 2019-09-26 ENCOUNTER — Other Ambulatory Visit: Payer: Self-pay | Admitting: Family Medicine

## 2019-11-16 ENCOUNTER — Other Ambulatory Visit: Payer: Self-pay | Admitting: Family Medicine

## 2019-11-16 ENCOUNTER — Other Ambulatory Visit: Payer: Self-pay

## 2019-11-16 DIAGNOSIS — E210 Primary hyperparathyroidism: Secondary | ICD-10-CM

## 2019-11-17 LAB — VITAMIN D 25 HYDROXY (VIT D DEFICIENCY, FRACTURES): Vit D, 25-Hydroxy: 20 ng/mL — ABNORMAL LOW (ref 30–100)

## 2019-11-17 LAB — PTH, INTACT AND CALCIUM
Calcium: 8.9 mg/dL (ref 8.6–10.4)
PTH: 38 pg/mL (ref 14–64)

## 2019-11-23 ENCOUNTER — Other Ambulatory Visit: Payer: Self-pay

## 2019-11-23 ENCOUNTER — Ambulatory Visit: Admitting: "Endocrinology

## 2019-11-23 ENCOUNTER — Encounter: Payer: Self-pay | Admitting: "Endocrinology

## 2019-11-23 VITALS — BP 133/82 | HR 90 | Ht 61.0 in | Wt 235.0 lb

## 2019-11-23 DIAGNOSIS — E559 Vitamin D deficiency, unspecified: Secondary | ICD-10-CM | POA: Diagnosis not present

## 2019-11-23 DIAGNOSIS — E210 Primary hyperparathyroidism: Secondary | ICD-10-CM

## 2019-11-23 MED ORDER — CHOLECALCIFEROL 50 MCG (2000 UT) PO CAPS: 1.0000 | ORAL_CAPSULE | Freq: Every day | ORAL | 3 refills | Status: AC

## 2019-11-23 NOTE — Progress Notes (Signed)
11/23/2019, 3:22 PM   Endocrinology follow-up note   Subjective:    Patient ID: Courtney Hickman, female    DOB: 1960-09-23, PCP Dennard Schaumann Cammie Mcgee, MD   Past Medical History:  Diagnosis Date   Anemia 08/23/2011   Dr. Benson Norway: EGD, Colonoscopy, Capsule Endo--all negative Had metrorhagia at that time   Arthritis    fingers   Hearing impairment    Heart burn    occ   Hyperparathyroidism ([place])    Hypertension    Wears hearing aid in both ears    Past Surgical History:  Procedure Laterality Date   CESAREAN SECTION     x2   COLONOSCOPY     PARATHYROIDECTOMY Right 09/19/2018   Procedure: RIGHT PARATHYROIDECTOMY;  Surgeon: Armandina Gemma, MD;  Location: [facility];  Service: General;  Laterality: Right;   WISDOM TOOTH EXTRACTION     Social History   Socioeconomic History   Marital status: Married    Spouse name: Not on file   Number of children: Not on file   Years of education: Not on file   Highest education level: Not on file  Occupational History   Not on file  Tobacco Use   Smoking status: Never Smoker   Smokeless tobacco: Never Used  Vaping Use   Vaping Use: Never used  Substance and Sexual Activity   Alcohol use: No   Drug use: No   Sexual activity: Not on file  Other Topics Concern   Not on file  Social History Narrative   Married. HomeMaker.   3 children; 39, 33, 43 y/o   Social Determinants of Radio broadcast assistant Strain:    Difficulty of Paying Living Expenses: Not on file  Food Insecurity:    Worried About Charity fundraiser in the Last Year: Not on file   [employer] of Food in the Last Year: Not on file  Transportation Needs:    Lack of Transportation (Medical): Not on file   Lack of Transportation (Non-Medical): Not on file  Physical Activity:    Days of Exercise per Week: Not on file   Minutes of Exercise per Session: Not on file  Stress:    Feeling of Stress :  Not on file  Social Connections:    Frequency of Communication with Friends and Family: Not on file   Frequency of Social Gatherings with Friends and Family: Not on file   Attends Religious Services: Not on file   Active Member of Clubs or Organizations: Not on file   Attends Archivist Meetings: Not on file   Marital Status: Not on file   Family History  Problem Relation Age of Onset   Diabetes Mother    Hypertension Father    Diabetes Brother    Hypertension Brother    COPD Brother    CVA Paternal Grandfather    Outpatient Encounter Medications as of 11/23/2019  Medication Sig   amLODipine (NORVASC) 10 MG tablet Take 1 tablet by mouth once daily   aspirin EC 81 MG tablet Take 81 mg by mouth daily.   atorvastatin (LIPITOR) 20 MG tablet Take 1 tablet by mouth once daily   Cholecalciferol 50 MCG (2000 UT)  CAPS Take 1 capsule (2,000 Units total) by mouth daily with breakfast.   CRANBERRY EXTRACT PO Take 1 capsule by mouth daily.   doxazosin (CARDURA) 2 MG tablet Take 1 tablet by mouth once daily   losartan (COZAAR) 100 MG tablet Take 1 tablet by mouth once daily   metoprolol succinate (TOPROL-XL) 25 MG 24 hr tablet Take 1 tablet (25 mg total) by mouth daily.   Potassium 99 MG TABS Take 99 mg by mouth daily.   No facility-administered encounter medications on file as of 11/23/2019.   ALLERGIES: Allergies  Allergen Reactions   Actifed Cold-Allergy [Chlorpheniramine-Phenyleph Er] Anaphylaxis    Eyes hurt / headache   Bactrim [Sulfamethoxazole-Trimethoprim] Rash    VACCINATION STATUS: Immunization History  Administered Date(s) Administered   Influenza,inj,Quad PF,6+ Mos 10/13/2012, 10/28/2013   Tdap 09/19/2011    HPI Courtney Hickman is 59 y.o. female who presents today for follow-up after she underwent parathyroidectomy on September 19, 2018.    She was previously seen in consult for primary hyperparathyroidism causing hypercalcemia.  She was  sent for parathyroidectomy Dr. Armandina Gemma. She underwent right superior parathyroid resection on September 19, 2018 with resolution of her hypercalcemia and hyperparathyroidism.  She has recovered very well from her surgical wound.  Her surgery showed hypercellular gland.   Her previsit labs are consistent with treatment effect, hypercalcemia resolved.  -She is not on any thyroid or calcium supplement at this time. She has no new complaints today.  She did not have recent bone density. -  Denies any history of thyrotoxicosis, CKD, sarcoidosis, she denies any complaint of chronic cough.  Review of systems: Limited as above.  Objective:    BP 133/82    Pulse 90    Ht 5\' 1"  (1.549 m)    Wt 235 lb (106.6 kg)    BMI 44.40 kg/m   Wt Readings from Last 3 Encounters:  11/23/19 235 lb (106.6 kg)  03/20/19 191 lb (86.6 kg)  02/05/19 188 lb (85.3 kg)   CMP     Component Value Date/Time   NA 140 02/06/2019 1100   K 4.3 02/06/2019 1100   CL 107 02/06/2019 1100   CO2 22 02/06/2019 1100   GLUCOSE 88 02/06/2019 1100   BUN 18 02/06/2019 1100   CREATININE 0.74 02/06/2019 1100   CALCIUM 8.9 11/16/2019 1412   PROT 6.8 02/06/2019 1100   ALBUMIN 4.0 05/17/2014 1110   AST 12 02/06/2019 1100   ALT 18 02/06/2019 1100   ALKPHOS 45 05/17/2014 1110   BILITOT 0.6 02/06/2019 1100   GFRNONAA 90 02/06/2019 1100   GFRAA 104 02/06/2019 1100    Lipid Panel     Component Value Date/Time   CHOL 175 02/06/2019 1100   TRIG 65 02/06/2019 1100   HDL 43 (L) 02/06/2019 1100   CHOLHDL 4.1 02/06/2019 1100   VLDL 36 05/17/2014 1110   LDLCALC 116 (H) 02/06/2019 1100      Lab Results  Component Value Date   TSH 1.46 11/12/2018   TSH 1.55 04/10/2018   TSH 1.57 02/03/2018   TSH 0.79 05/20/2017   TSH 0.917 05/17/2014   FREET4 1.3 11/12/2018   FREET4 1.4 04/10/2018    Recent Results (from the past 2160 hour(s))  VITAMIN D 25 Hydroxy (Vit-D Deficiency, Fractures)     Status: Abnormal   Collection Time:  11/16/19  2:12 PM  Result Value Ref Range   Vit D, 25-Hydroxy 20 (L) 30 - 100 ng/mL    Comment: Vitamin  D Status         25-OH Vitamin D: . Deficiency:                    <20 ng/mL Insufficiency:             20 - 29 ng/mL Optimal:                 > or = 30 ng/mL . For 25-OH Vitamin D testing on patients on  D2-supplementation and patients for whom quantitation  of D2 and D3 fractions is required, the [facility](TM) 25-OH VIT D, (D2,D3), LC/MS/MS is recommended: order  code [id number] (patients >51yrs). See Note 1 . Note 1 . For additional information, please refer to  http://education.[facility].com/faq/FAQ199  (This link is being provided for informational/ educational purposes only.)   PTH, Intact and Calcium     Status: None   Collection Time: 11/16/19  2:12 PM  Result Value Ref Range   PTH 38 14 - 64 pg/mL    Comment: . Interpretive Guide    Intact PTH           Calcium ------------------    ----------           ------- Normal Parathyroid    Normal               Normal Hypoparathyroidism    Low or Low Normal    Low Hyperparathyroidism    Primary            Normal or High       High    Secondary          High                 Normal or Low    Tertiary           High                 High Non-Parathyroid    Hypercalcemia      Low or Low Normal    High .    Calcium 8.9 8.6 - 10.4 mg/dL      Assessment & Plan:   1. Hypercalcemia  Assessment: 1. Hypercalcemia -resolved 2.  Primary hyperparathyroidism-status post surgery Plan: -She underwent right superior parathyroid resection on September 19, 2018. -Her previsit labs show PTH stable at 38, calcium stable at 8.9.   -She will not need any further supplements nor intervention at this time.   her surgery is successful.  She is advised to get bone density through her primary care doctor before her next visit in 1 year.  She will also have repeat  PTH/calcium, and thyroid function tests.  -She will need vitamin D  supplement, discussed and initiated vitamin D3 2000 units daily. - I advised her  to maintain close follow up with Susy Frizzle, MD for primary care needs.     - Time spent on this patient care encounter:  20 minutes of which 50% was spent in  counseling and the rest reviewing  her current and  previous labs / studies and medications  doses and developing a plan for long term care. Courtney Hickman  participated in the discussions, expressed understanding, and voiced agreement with the above plans.  All questions were answered to her satisfaction. she is encouraged to contact clinic should she have any questions or concerns prior to her return visit.   Follow up plan: Return in about 1 year (around 11/22/2020)  for F/U with Pre-visit Labs.   Glade Lloyd, MD [facility] [address] [place], [place] [address] Phone: [phone]  Fax: [phone]     11/23/2019, 3:22 PM  This note was partially dictated with voice recognition software. Similar sounding words can be transcribed inadequately or may not  be corrected upon review.

## 2019-12-08 ENCOUNTER — Other Ambulatory Visit: Payer: Self-pay | Admitting: Family Medicine

## 2019-12-08 DIAGNOSIS — I10 Essential (primary) hypertension: Secondary | ICD-10-CM

## 2019-12-15 ENCOUNTER — Telehealth: Payer: Self-pay

## 2019-12-15 MED ORDER — ATORVASTATIN CALCIUM 20 MG PO TABS
20.0000 mg | ORAL_TABLET | Freq: Every day | ORAL | 3 refills | Status: DC
Start: 2019-12-15 — End: 2019-12-25

## 2019-12-15 NOTE — Telephone Encounter (Signed)
Patient called Courtney Hickman is no longer servicing [employer] [employer], so Mrs. Tyrell is needing to switch pharmacies

## 2019-12-25 ENCOUNTER — Other Ambulatory Visit: Payer: Self-pay

## 2019-12-25 MED ORDER — AMLODIPINE BESYLATE 10 MG PO TABS
10.0000 mg | ORAL_TABLET | Freq: Every day | ORAL | 3 refills | Status: DC
Start: 2019-12-25 — End: 2020-12-19

## 2019-12-25 MED ORDER — ATORVASTATIN CALCIUM 20 MG PO TABS
20.0000 mg | ORAL_TABLET | Freq: Every day | ORAL | 3 refills | Status: DC
Start: 2019-12-25 — End: 2020-09-01

## 2020-02-01 ENCOUNTER — Other Ambulatory Visit: Payer: Self-pay

## 2020-02-01 MED ORDER — METOPROLOL SUCCINATE ER 25 MG PO TB24
25.0000 mg | ORAL_TABLET | Freq: Every day | ORAL | 3 refills | Status: DC
Start: 2020-02-01 — End: 2021-01-24

## 2020-02-01 MED ORDER — DOXAZOSIN MESYLATE 2 MG PO TABS
2.0000 mg | ORAL_TABLET | Freq: Every day | ORAL | 2 refills | Status: DC
Start: 2020-02-01 — End: 2020-10-25

## 2020-03-09 ENCOUNTER — Other Ambulatory Visit: Payer: Self-pay

## 2020-03-09 DIAGNOSIS — I10 Essential (primary) hypertension: Secondary | ICD-10-CM

## 2020-03-09 MED ORDER — LOSARTAN POTASSIUM 100 MG PO TABS: 100.0000 mg | ORAL_TABLET | Freq: Every day | ORAL | 0 refills | Status: DC

## 2020-06-05 ENCOUNTER — Other Ambulatory Visit: Payer: Self-pay | Admitting: Family Medicine

## 2020-06-05 DIAGNOSIS — I10 Essential (primary) hypertension: Secondary | ICD-10-CM

## 2020-07-21 ENCOUNTER — Ambulatory Visit
Admission: EM | Admit: 2020-07-21 | Discharge: 2020-07-21 | Disposition: A | Discharge status: Home / Self Care | Payer: Self-pay | Attending: Emergency Medicine | Admitting: Emergency Medicine

## 2020-07-21 ENCOUNTER — Encounter: Payer: Self-pay | Admitting: Emergency Medicine

## 2020-07-21 DIAGNOSIS — H109 Unspecified conjunctivitis: Secondary | ICD-10-CM

## 2020-07-21 DIAGNOSIS — H66001 Acute suppurative otitis media without spontaneous rupture of ear drum, right ear: Secondary | ICD-10-CM

## 2020-07-21 MED ORDER — AMOXICILLIN 500 MG PO CAPS: 500.0000 mg | ORAL_CAPSULE | Freq: Two times a day (BID) | ORAL | 0 refills | Status: AC

## 2020-07-21 MED ORDER — OFLOXACIN 0.3 % OP SOLN: 1.0000 [drp] | Freq: Four times a day (QID) | OPHTHALMIC | 0 refills | Status: DC

## 2020-07-21 NOTE — ED Triage Notes (Signed)
Bilateral eye redness since last night

## 2020-07-21 NOTE — Discharge Instructions (Addendum)
Conjunctivitis Use eye drops as prescribed and to completion Wash pillow cases, wash hands regularly with soap and water, avoid touching your face and eyes, wash door handles, light switches, remotes and other objects you frequently touch Return or follow up with PCP if symptoms persists such as fever, chills, redness, swelling, eye pain, painful eye movements, vision changes, etc...  Amoxicillin for RT ear infection

## 2020-07-21 NOTE — ED Provider Notes (Signed)
[facility]   [id number] 07/21/20 Arrival Time: 9629  CC: Red eye  SUBJECTIVE:  Courtney Hickman is a 60 y.o. female who presents with complaint of bilateral eye redness, irritation, itching and clear drainage x 1 day.  Denies a precipitating event, trauma, or close contacts with similar symptoms.  Denies alleviating or aggravating factors.  Reports similar symptoms in the past.  Denies fever, chills, nausea, vomiting, eye pain, painful eye movements, vision changes, double vision, FB sensation, periorbital erythema.     Denies contact lens use.    Patient also mention RT ear pain x 1 week.  Speculates she may have gotten water in it.  Has improved.  Would like RT ear evaluated.    ROS: As per HPI.  All other pertinent ROS negative.     Past Medical History:  Diagnosis Date   Anemia 08/23/2011   Dr. Benson Norway: EGD, Colonoscopy, Capsule Endo--all negative Had metrorhagia at that time   Arthritis    fingers   Hearing impairment    Heart burn    occ   Hyperparathyroidism ([place])    Hypertension    Wears hearing aid in both ears    Past Surgical History:  Procedure Laterality Date   CESAREAN SECTION     x2   COLONOSCOPY     PARATHYROIDECTOMY Right 09/19/2018   Procedure: RIGHT PARATHYROIDECTOMY;  Surgeon: Armandina Gemma, MD;  Location: [facility];  Service: General;  Laterality: Right;   WISDOM TOOTH EXTRACTION     Allergies  Allergen Reactions   Actifed Cold-Allergy [Chlorpheniramine-Phenyleph Er] Anaphylaxis    Eyes hurt / headache   Bactrim [Sulfamethoxazole-Trimethoprim] Rash   No current facility-administered medications on file prior to encounter.   Current Outpatient Medications on File Prior to Encounter  Medication Sig Dispense Refill   amLODipine (NORVASC) 10 MG tablet Take 1 tablet (10 mg total) by mouth daily. 90 tablet 3   aspirin EC 81 MG tablet Take 81 mg by mouth daily.     atorvastatin (LIPITOR) 20 MG tablet Take 1 tablet (20 mg total) by mouth daily. 30 tablet  3   Cholecalciferol 50 MCG (2000 UT) CAPS Take 1 capsule (2,000 Units total) by mouth daily with breakfast. 90 capsule 3   CRANBERRY EXTRACT PO Take 1 capsule by mouth daily.     doxazosin (CARDURA) 2 MG tablet Take 1 tablet (2 mg total) by mouth daily. 90 tablet 2   losartan (COZAAR) 100 MG tablet TAKE 1 TABLET(100 MG) BY MOUTH DAILY 90 tablet 0   metoprolol succinate (TOPROL-XL) 25 MG 24 hr tablet Take 1 tablet (25 mg total) by mouth daily. 90 tablet 3   Potassium 99 MG TABS Take 99 mg by mouth daily.     Social History   Socioeconomic History   Marital status: Married    Spouse name: Not on file   Number of children: Not on file   Years of education: Not on file   Highest education level: Not on file  Occupational History   Not on file  Tobacco Use   Smoking status: Never   Smokeless tobacco: Never  Vaping Use   Vaping Use: Never used  Substance and Sexual Activity   Alcohol use: No   Drug use: No   Sexual activity: Not on file  Other Topics Concern   Not on file  Social History Narrative   Married. HomeMaker.   3 children; 42, 51, 44 y/o   Social Determinants of Engineer, drilling  Resource Strain: Not on file  Food Insecurity: Not on file  Transportation Needs: Not on file  Physical Activity: Not on file  Stress: Not on file  Social Connections: Not on file  Intimate Partner Violence: Not on file   Family History  Problem Relation Age of Onset   Diabetes Mother    Hypertension Father    Diabetes Brother    Hypertension Brother    COPD Brother    CVA Paternal Grandfather     OBJECTIVE:   Vitals:   07/21/20 1623  BP: (!) 147/88  Pulse: 86  Resp: 18  Temp: 98.1 F (36.7 C)  TempSrc: Oral  SpO2: 96%    General appearance: alert; no distress HENT: NCAT; hearing aids present; RT EAC swollen and erythematous, purulent drainage behind RT TM, LT EAC clear, LT TM pearly gray; nares patent; oropharynx clear Eyes: mild to moderate conjunctival erythema.  PERRL; EOMI without discomfort;  dried purulent drainage Neck: supple Lungs: normal respiratory effort Skin: warm and dry Psychological: alert and cooperative; normal mood and affect   ASSESSMENT & PLAN:  1. Bacterial conjunctivitis   2. Non-recurrent acute suppurative otitis media of right ear without spontaneous rupture of tympanic membrane     Meds ordered this encounter  Medications   amoxicillin (AMOXIL) 500 MG capsule    Sig: Take 1 capsule (500 mg total) by mouth 2 (two) times daily for 10 days.    Dispense:  20 capsule    Refill:  0    Order Specific Question:   Supervising Provider    Answer:   Raylene Everts [[id number]]   ofloxacin (OCUFLOX) 0.3 % ophthalmic solution    Sig: Place 1 drop into both eyes 4 (four) times daily.    Dispense:  10 mL    Refill:  0    Order Specific Question:   Supervising Provider    Answer:   Raylene Everts [[id number]]     Conjunctivitis Use eye drops as prescribed and to completion Wash pillow cases, wash hands regularly with soap and water, avoid touching your face and eyes, wash door handles, light switches, remotes and other objects you frequently touch Return or follow up with PCP if symptoms persists such as fever, chills, redness, swelling, eye pain, painful eye movements, vision changes, etc...  Amoxicillin for RT ear infection  Reviewed expectations re: course of current medical issues. Questions answered. Outlined signs and symptoms indicating need for more acute intervention. Patient verbalized understanding. After Visit Summary given.    Lestine Box, PA-C 07/21/20 1704

## 2020-09-01 ENCOUNTER — Other Ambulatory Visit: Payer: Self-pay | Admitting: Family Medicine

## 2020-09-03 ENCOUNTER — Other Ambulatory Visit: Payer: Self-pay | Admitting: Family Medicine

## 2020-09-03 DIAGNOSIS — I10 Essential (primary) hypertension: Secondary | ICD-10-CM

## 2020-10-25 ENCOUNTER — Other Ambulatory Visit: Payer: Self-pay | Admitting: Family Medicine

## 2020-11-15 ENCOUNTER — Telehealth: Payer: Self-pay | Admitting: "Endocrinology

## 2020-11-15 ENCOUNTER — Other Ambulatory Visit: Payer: Self-pay

## 2020-11-15 DIAGNOSIS — E210 Primary hyperparathyroidism: Secondary | ICD-10-CM

## 2020-11-15 DIAGNOSIS — E559 Vitamin D deficiency, unspecified: Secondary | ICD-10-CM

## 2020-11-15 NOTE — Telephone Encounter (Signed)
Labs have been ordered for this patient for [facility].

## 2020-11-15 NOTE — Telephone Encounter (Signed)
Pt has not been seen in a year, her appt is soon and her lab orders need updated for [facility].

## 2020-11-17 LAB — PTH, INTACT AND CALCIUM
Calcium: 9 mg/dL (ref 8.7–10.2)
PTH: 41 pg/mL (ref 15–65)

## 2020-11-17 LAB — VITAMIN D 25 HYDROXY (VIT D DEFICIENCY, FRACTURES): Vit D, 25-Hydroxy: 49.7 ng/mL (ref 30.0–100.0)

## 2020-11-17 LAB — T4, FREE: Free T4: 1.36 ng/dL (ref 0.82–1.77)

## 2020-11-17 LAB — TSH: TSH: 1.47 u[IU]/mL (ref 0.450–4.500)

## 2020-11-18 ENCOUNTER — Ambulatory Visit (INDEPENDENT_AMBULATORY_CARE_PROVIDER_SITE_OTHER)

## 2020-11-18 ENCOUNTER — Other Ambulatory Visit: Payer: Self-pay

## 2020-11-18 ENCOUNTER — Encounter: Payer: Self-pay | Admitting: Emergency Medicine

## 2020-11-18 ENCOUNTER — Ambulatory Visit
Admission: EM | Admit: 2020-11-18 | Discharge: 2020-11-18 | Disposition: A | Discharge status: Home / Self Care | Attending: Urgent Care | Admitting: Urgent Care

## 2020-11-18 DIAGNOSIS — R059 Cough, unspecified: Secondary | ICD-10-CM | POA: Diagnosis not present

## 2020-11-18 DIAGNOSIS — R062 Wheezing: Secondary | ICD-10-CM

## 2020-11-18 DIAGNOSIS — R058 Other specified cough: Secondary | ICD-10-CM

## 2020-11-18 DIAGNOSIS — J209 Acute bronchitis, unspecified: Secondary | ICD-10-CM

## 2020-11-18 DIAGNOSIS — R0602 Shortness of breath: Secondary | ICD-10-CM

## 2020-11-18 MED ORDER — PROMETHAZINE-DM 6.25-15 MG/5ML PO SYRP: 5.0000 mL | ORAL_SOLUTION | Freq: Every evening | ORAL | 0 refills | Status: DC | PRN

## 2020-11-18 MED ORDER — ALBUTEROL SULFATE HFA 108 (90 BASE) MCG/ACT IN AERS: 1.0000 | INHALATION_SPRAY | Freq: Four times a day (QID) | RESPIRATORY_TRACT | 0 refills | Status: AC | PRN

## 2020-11-18 MED ORDER — PREDNISONE 10 MG PO TABS: 30.0000 mg | ORAL_TABLET | Freq: Every day | ORAL | 0 refills | Status: DC

## 2020-11-18 NOTE — ED Provider Notes (Signed)
[facility]   MRN: [id number] DOB: 1960/12/16  Subjective:   Courtney Hickman is a 60 y.o. female presenting for 1 week history of productive cough, wheezing, intermittent shob, coughing fits. She is not a smoker. No asthma. Does not want COVID or flu testing. Is worried about having pneumonia.  No history of heart failure.  She has a history of hypertension and is compliant with her medications.  No current facility-administered medications for this encounter.  Current Outpatient Medications:    amLODipine (NORVASC) 10 MG tablet, Take 1 tablet (10 mg total) by mouth daily., Disp: 90 tablet, Rfl: 3   aspirin EC 81 MG tablet, Take 81 mg by mouth daily., Disp: , Rfl:    atorvastatin (LIPITOR) 20 MG tablet, TAKE 1 TABLET(20 MG) BY MOUTH DAILY, Disp: 30 tablet, Rfl: 3   Cholecalciferol 50 MCG (2000 UT) CAPS, Take 1 capsule (2,000 Units total) by mouth daily with breakfast., Disp: 90 capsule, Rfl: 3   CRANBERRY EXTRACT PO, Take 1 capsule by mouth daily., Disp: , Rfl:    doxazosin (CARDURA) 2 MG tablet, TAKE 1 TABLET(2 MG) BY MOUTH DAILY, Disp: 90 tablet, Rfl: 2   losartan (COZAAR) 100 MG tablet, TAKE 1 TABLET(100 MG) BY MOUTH DAILY, Disp: 90 tablet, Rfl: 0   metoprolol succinate (TOPROL-XL) 25 MG 24 hr tablet, Take 1 tablet (25 mg total) by mouth daily., Disp: 90 tablet, Rfl: 3   ofloxacin (OCUFLOX) 0.3 % ophthalmic solution, Place 1 drop into both eyes 4 (four) times daily., Disp: 10 mL, Rfl: 0   Potassium 99 MG TABS, Take 99 mg by mouth daily., Disp: , Rfl:    Allergies  Allergen Reactions   Actifed Cold-Allergy [Chlorpheniramine-Phenyleph Er] Anaphylaxis    Eyes hurt / headache   Bactrim [Sulfamethoxazole-Trimethoprim] Rash    Past Medical History:  Diagnosis Date   Anemia 08/23/2011   Dr. Benson Norway: EGD, Colonoscopy, Capsule Endo--all negative Had metrorhagia at that time   Arthritis    fingers   Hearing impairment    Heart burn    occ   Hyperparathyroidism ([place])     Hypertension    Wears hearing aid in both ears      Past Surgical History:  Procedure Laterality Date   CESAREAN SECTION     x2   COLONOSCOPY     PARATHYROIDECTOMY Right 09/19/2018   Procedure: RIGHT PARATHYROIDECTOMY;  Surgeon: Armandina Gemma, MD;  Location: [facility];  Service: General;  Laterality: Right;   WISDOM TOOTH EXTRACTION      Family History  Problem Relation Age of Onset   Diabetes Mother    Hypertension Father    Diabetes Brother    Hypertension Brother    COPD Brother    CVA Paternal Grandfather     Social History   Tobacco Use   Smoking status: Never   Smokeless tobacco: Never  Vaping Use   Vaping Use: Never used  Substance Use Topics   Alcohol use: No   Drug use: No    ROS   Objective:   Vitals: BP (!) 184/81 (BP Location: Right Arm)   Pulse 92   Temp 98.5 F (36.9 C) (Oral)   Resp (!) 22   SpO2 92%   Pulse oximetry recheck was 96%.  Physical Exam Constitutional:      General: She is not in acute distress.    Appearance: Normal appearance. She is well-developed. She is not ill-appearing, toxic-appearing or diaphoretic.  HENT:     Head: Normocephalic and  atraumatic.     Nose: Nose normal.     Mouth/Throat:     Mouth: Mucous membranes are moist.  Eyes:     Extraocular Movements: Extraocular movements intact.     Pupils: Pupils are equal, round, and reactive to light.  Cardiovascular:     Rate and Rhythm: Normal rate and regular rhythm.     Pulses: Normal pulses.     Heart sounds: Normal heart sounds. No murmur heard.   No friction rub. No gallop.  Pulmonary:     Effort: Pulmonary effort is normal. No respiratory distress.     Breath sounds: Normal breath sounds. No stridor. No wheezing, rhonchi or rales.  Skin:    General: Skin is warm and dry.     Findings: No rash.  Neurological:     Mental Status: She is alert and oriented to person, place, and time.  Psychiatric:        Mood and Affect: Mood normal.        Behavior: Behavior  normal.        Thought Content: Thought content normal.    DG Chest 2 View  Result Date: 11/18/2020 CLINICAL DATA:  Shortness of breath and cough EXAM: CHEST - 2 VIEW COMPARISON:  Chest CT 08/23/2015 FINDINGS: Cardiomegaly.  No focal opacity, pleural effusion or pneumothorax. IMPRESSION: No active cardiopulmonary disease.  Cardiomegaly Electronically Signed   By: Donavan Foil M.D.   On: 11/18/2020 19:20     Assessment and Plan :   PDMP not reviewed this encounter.  1. Acute bronchitis, unspecified organism   2. Wheezing   3. Shortness of breath   4. Productive cough    Patient refused respiratory testing for COVID, flu.  She has reassuring chest x-ray, lung exam.  Counseled that we could manage her with supportive care for acute bronchitis but she requested aggressive management to help her with her cough.  We will use an oral prednisone course, albuterol and cough suppression medications. Counseled patient on potential for adverse effects with medications prescribed/recommended today, ER and return-to-clinic precautions discussed, patient verbalized understanding.    Jaynee Eagles, [place] 11/18/20 1935

## 2020-11-18 NOTE — ED Triage Notes (Signed)
Patient c/o productive cough w/ "clear to yellow to green sputum" x 1 week.   Patient c/o wheezing and chills x 6 days.   Patient endorses loss of appetite. Patient endorses SOB upon exertion.   Patient endorses chest congestion.   Patient has taken OTC cough medicine with some relief of symptoms.

## 2020-11-22 ENCOUNTER — Encounter: Payer: Self-pay | Admitting: "Endocrinology

## 2020-11-22 ENCOUNTER — Ambulatory Visit (INDEPENDENT_AMBULATORY_CARE_PROVIDER_SITE_OTHER): Admitting: "Endocrinology

## 2020-11-22 ENCOUNTER — Other Ambulatory Visit: Payer: Self-pay

## 2020-11-22 VITALS — BP 136/80 | HR 96 | Ht 61.0 in | Wt 265.4 lb

## 2020-11-22 DIAGNOSIS — E210 Primary hyperparathyroidism: Secondary | ICD-10-CM

## 2020-11-22 DIAGNOSIS — E559 Vitamin D deficiency, unspecified: Secondary | ICD-10-CM | POA: Diagnosis not present

## 2020-11-22 NOTE — Progress Notes (Signed)
11/22/2020, 12:20 PM   Endocrinology follow-up note   Subjective:    Patient ID: Courtney Hickman, female    DOB: [birth year], PCP Dennard Schaumann Cammie Mcgee, MD   Past Medical History:  Diagnosis Date   Anemia 08/23/2011   Dr. Benson Norway: EGD, Colonoscopy, Capsule Endo--all negative Had metrorhagia at that time   Arthritis    fingers   Hearing impairment    Heart burn    occ   Hyperparathyroidism ([place])    Hypertension    Wears hearing aid in both ears    Past Surgical History:  Procedure Laterality Date   CESAREAN SECTION     x2   COLONOSCOPY     PARATHYROIDECTOMY Right 09/19/2018   Procedure: RIGHT PARATHYROIDECTOMY;  Surgeon: Armandina Gemma, MD;  Location: [facility];  Service: General;  Laterality: Right;   WISDOM TOOTH EXTRACTION     Social History   Socioeconomic History   Marital status: Married    Spouse name: Not on file   Number of children: Not on file   Years of education: Not on file   Highest education level: Not on file  Occupational History   Not on file  Tobacco Use   Smoking status: Never   Smokeless tobacco: Never  Vaping Use   Vaping Use: Never used  Substance and Sexual Activity   Alcohol use: No   Drug use: No   Sexual activity: Not on file  Other Topics Concern   Not on file  Social History Narrative   Married. HomeMaker.   3 children; 5, 8, 30 y/o   Social Determinants of Radio broadcast assistant Strain: Not on file  Food Insecurity: Not on file  Transportation Needs: Not on file  Physical Activity: Not on file  Stress: Not on file  Social Connections: Not on file   Family History  Problem Relation Age of Onset   Diabetes Mother    Hypertension Father    Diabetes Brother    Hypertension Brother    COPD Brother    CVA Paternal Grandfather    Outpatient Encounter Medications as of 11/22/2020  Medication Sig   albuterol (VENTOLIN HFA) 108 (90 Base) MCG/ACT inhaler Inhale 1-2 puffs into  the lungs every 6 (six) hours as needed for wheezing or shortness of breath.   amLODipine (NORVASC) 10 MG tablet Take 1 tablet (10 mg total) by mouth daily.   aspirin EC 81 MG tablet Take 81 mg by mouth daily.   atorvastatin (LIPITOR) 20 MG tablet TAKE 1 TABLET(20 MG) BY MOUTH DAILY   Cholecalciferol 50 MCG (2000 UT) CAPS Take 1 capsule (2,000 Units total) by mouth daily with breakfast.   CRANBERRY EXTRACT PO Take 1 capsule by mouth daily.   doxazosin (CARDURA) 2 MG tablet TAKE 1 TABLET(2 MG) BY MOUTH DAILY   losartan (COZAAR) 100 MG tablet TAKE 1 TABLET(100 MG) BY MOUTH DAILY   metoprolol succinate (TOPROL-XL) 25 MG 24 hr tablet Take 1 tablet (25 mg total) by mouth daily.   ofloxacin (OCUFLOX) 0.3 % ophthalmic solution Place 1 drop into both eyes 4 (four) times daily. (Patient not taking: Reported on 11/22/2020)   Potassium 99 MG TABS Take 99 mg by mouth daily. (  Patient not taking: Reported on 11/22/2020)   predniSONE (DELTASONE) 10 MG tablet Take 3 tablets (30 mg total) by mouth daily with breakfast.   promethazine-dextromethorphan (PROMETHAZINE-DM) 6.25-15 MG/5ML syrup Take 5 mLs by mouth at bedtime as needed for cough.   No facility-administered encounter medications on file as of 11/22/2020.   ALLERGIES: Allergies  Allergen Reactions   Actifed Cold-Allergy [Chlorpheniramine-Phenyleph Er] Anaphylaxis    Eyes hurt / headache   Bactrim [Sulfamethoxazole-Trimethoprim] Rash    VACCINATION STATUS: Immunization History  Administered Date(s) Administered   Influenza,inj,Quad PF,6+ Mos 10/13/2012, 10/28/2013   Influenza-Unspecified 11/23/2019   Tdap 09/19/2011    HPI Courtney Hickman is 60 y.o. female who presents today for follow-up after she underwent parathyroidectomy on September 19, 2018.    She was previously seen in consult for primary hyperparathyroidism causing hypercalcemia.  She was sent for parathyroidectomy Dr. Armandina Gemma. She underwent right superior parathyroid resection on  September 19, 2018 with resolution of her hypercalcemia and hyperparathyroidism.  She has recovered very well from her surgical wound.  Her surgery showed hypercellular gland.   Her previsit labs are consistent with treatment effect, hypercalcemia resolved.  Her calcium is 9, PTH 41.  She is on treatment for vitamin D deficiency.  -She is not on any thyroid or calcium supplement at this time. She has no new complaints today.  She did not have recent bone density. -  Denies any history of thyrotoxicosis, CKD, sarcoidosis, she denies any complaint of chronic cough.  Review of systems: Limited as above.  Objective:    BP 136/80   Pulse 96   Ht 5\' 1"  (1.549 m)   Wt 265 lb 6.4 oz (120.4 kg)   BMI 50.15 kg/m   Wt Readings from Last 3 Encounters:  11/22/20 265 lb 6.4 oz (120.4 kg)  11/23/19 235 lb (106.6 kg)  03/20/19 191 lb (86.6 kg)   CMP     Component Value Date/Time   NA 140 02/06/2019 1100   K 4.3 02/06/2019 1100   CL 107 02/06/2019 1100   CO2 22 02/06/2019 1100   GLUCOSE 88 02/06/2019 1100   BUN 18 02/06/2019 1100   CREATININE 0.74 02/06/2019 1100   CALCIUM 9.0 11/16/2020 1400   PROT 6.8 02/06/2019 1100   ALBUMIN 4.0 05/17/2014 1110   AST 12 02/06/2019 1100   ALT 18 02/06/2019 1100   ALKPHOS 45 05/17/2014 1110   BILITOT 0.6 02/06/2019 1100   GFRNONAA 90 02/06/2019 1100   GFRAA 104 02/06/2019 1100    Lipid Panel     Component Value Date/Time   CHOL 175 02/06/2019 1100   TRIG 65 02/06/2019 1100   HDL 43 (L) 02/06/2019 1100   CHOLHDL 4.1 02/06/2019 1100   VLDL 36 05/17/2014 1110   LDLCALC 116 (H) 02/06/2019 1100      Lab Results  Component Value Date   TSH 1.470 11/16/2020   TSH 1.46 11/12/2018   TSH 1.55 04/10/2018   TSH 1.57 02/03/2018   TSH 0.79 05/20/2017   TSH 0.917 05/17/2014   FREET4 1.36 11/16/2020   FREET4 1.3 11/12/2018   FREET4 1.4 04/10/2018    Recent Results (from the past 2160 hour(s))  Vitamin D, 25-hydroxy     Status: None   Collection  Time: 11/16/20  2:00 PM  Result Value Ref Range   Vit D, 25-Hydroxy 49.7 30.0 - 100.0 ng/mL    Comment: Vitamin D deficiency has been defined by the Institute of Medicine and an Endocrine Society practice  guideline as a level of serum 25-OH vitamin D less than 20 ng/mL (1,2). The Endocrine Society went on to further define vitamin D insufficiency as a level between 21 and 29 ng/mL (2). 1. IOM (Institute of Medicine). 2010. Dietary reference    intakes for calcium and D. [place]: The    [employer]. 2. Holick MF, Binkley [place]zoni, Bischoff-Ferrari HA, et al.    Evaluation, treatment, and prevention of vitamin D    deficiency: an Endocrine Society clinical practice    guideline. JCEM. 2011 Jul; 96(7):1911-30.   T4, Free     Status: None   Collection Time: 11/16/20  2:00 PM  Result Value Ref Range   Free T4 1.36 0.82 - 1.77 ng/dL  TSH     Status: None   Collection Time: 11/16/20  2:00 PM  Result Value Ref Range   TSH 1.470 0.450 - 4.500 uIU/mL  PTH, Intact and Calcium     Status: None   Collection Time: 11/16/20  2:00 PM  Result Value Ref Range   Calcium 9.0 8.7 - 10.2 mg/dL   PTH 41 15 - 65 pg/mL   PTH Interp Comment     Comment: Interpretation                 Intact PTH    Calcium                                 (pg/mL)      (mg/dL) Normal                          15 - 65     8.6 - 10.2 Primary Hyperparathyroidism         >65          >10.2 Secondary Hyperparathyroidism       >65          <10.2 Non-Parathyroid Hypercalcemia       <65          >10.2 Hypoparathyroidism                  <15          < 8.6 Non-Parathyroid Hypocalcemia    15 - 65          < 8.6       Assessment & Plan:   1. Hypercalcemia  Assessment: 1. Hypercalcemia -resolved 2.  Primary hyperparathyroidism-status post surgery Plan: -She underwent right superior parathyroid resection on September 19, 2018. -Her previsit labs show calcium of 9 and PTH of 41, remaining stable.  Is indicated for  successful surgery.    -She will not need any further supplements nor intervention at this time.   She is advised to get bone density through her primary care doctor.   -She will only return to clinic as needed.  -She will need vitamin D supplement, discussed and initiated vitamin D3 2000 units daily. - I advised her  to maintain close follow up with Susy Frizzle, MD for primary care needs.  I spent 21 minutes in the care of the patient today including review of labs from Thyroid Function, CMP, and other relevant labs ; imaging/biopsy records (current and previous including abstractions from other facilities); face-to-face time discussing  her lab results and symptoms, medications doses, her options of short and long term treatment based on the latest standards of  care / guidelines;   and documenting the encounter.  Courtney Hickman  participated in the discussions, expressed understanding, and voiced agreement with the above plans.  All questions were answered to her satisfaction. she is encouraged to contact clinic should she have any questions or concerns prior to her return visit.   Follow up plan: Return if symptoms worsen or fail to improve.   Glade Lloyd, MD [facility] [address] [place], [place] [address] Phone: [phone]  Fax: [phone]     11/22/2020, 12:20 PM  This note was partially dictated with voice recognition software. Similar sounding words can be transcribed inadequately or may not  be corrected upon review.

## 2020-12-02 ENCOUNTER — Other Ambulatory Visit: Payer: Self-pay | Admitting: Family Medicine

## 2020-12-02 DIAGNOSIS — I10 Essential (primary) hypertension: Secondary | ICD-10-CM

## 2020-12-06 ENCOUNTER — Other Ambulatory Visit: Payer: Self-pay

## 2020-12-06 ENCOUNTER — Ambulatory Visit (INDEPENDENT_AMBULATORY_CARE_PROVIDER_SITE_OTHER): Admitting: Family Medicine

## 2020-12-06 ENCOUNTER — Encounter: Payer: Self-pay | Admitting: Family Medicine

## 2020-12-06 VITALS — BP 148/88 | HR 82 | Temp 98.1°F | Resp 18 | Ht 61.0 in | Wt 262.0 lb

## 2020-12-06 DIAGNOSIS — I10 Essential (primary) hypertension: Secondary | ICD-10-CM | POA: Diagnosis not present

## 2020-12-06 DIAGNOSIS — Z23 Encounter for immunization: Secondary | ICD-10-CM

## 2020-12-06 DIAGNOSIS — Z6841 Body Mass Index (BMI) 40.0 and over, adult: Secondary | ICD-10-CM

## 2020-12-06 DIAGNOSIS — G4710 Hypersomnia, unspecified: Secondary | ICD-10-CM | POA: Diagnosis not present

## 2020-12-06 MED ORDER — DOXAZOSIN MESYLATE 4 MG PO TABS: 4.0000 mg | ORAL_TABLET | Freq: Every day | ORAL | 3 refills | Status: DC

## 2020-12-06 NOTE — Progress Notes (Signed)
Subjective:    Patient ID: Courtney Hickman, female    DOB: 01-27-1960, 60 y.o.   MRN: [id number]  HPI Patient presents today for a follow-up of her hypertension.  Her blood pressure is out of control and is still elevated at 188 systolic.  This is despite taking amlodipine, losartan, metoprolol, and doxazosin.  However she is only on 2 mg of doxazosin a day.  Her weight has also increased substantially since I last saw her and her BMI is now greater than 40.  She breathes heavily during our encounter today.  She has a large circumference neck and a short mandible.  Her brother has obstructive sleep apnea.  She has to sleep sitting upright in a chair in order to breathe better per her report.  She denies any chest pain.  She denies any pleurisy.  However she does not have a bed partner who is ever mention her having apneic episodes.  She does report hypersomnolence.  However I am concerned that she may have obstructive sleep apnea due to her poorly controlled blood pressure and her BMI.  She also is requesting assistance in weight loss Past Medical History:  Diagnosis Date   Anemia 08/23/2011   Dr. Benson Norway: EGD, Colonoscopy, Capsule Endo--all negative Had metrorhagia at that time   Arthritis    fingers   Hearing impairment    Heart burn    occ   Hyperparathyroidism ([place])    Hypertension    Wears hearing aid in both ears    Patient is hearing impaired.  This is been since birth.  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 Past Surgical History:  Procedure Laterality Date   CESAREAN SECTION     x2   COLONOSCOPY     PARATHYROIDECTOMY Right 09/19/2018   Procedure: RIGHT PARATHYROIDECTOMY;  Surgeon: Armandina Gemma, MD;  Location: [facility];  Service: General;  Laterality: Right;   WISDOM TOOTH EXTRACTION     Current Outpatient Medications on File Prior to Visit  Medication Sig Dispense Refill    albuterol (VENTOLIN HFA) 108 (90 Base) MCG/ACT inhaler Inhale 1-2 puffs into the lungs every 6 (six) hours as needed for wheezing or shortness of breath. 18 g 0   amLODipine (NORVASC) 10 MG tablet Take 1 tablet (10 mg total) by mouth daily. 90 tablet 3   aspirin EC 81 MG tablet Take 81 mg by mouth daily.     atorvastatin (LIPITOR) 20 MG tablet TAKE 1 TABLET(20 MG) BY MOUTH DAILY 30 tablet 3   Cholecalciferol 50 MCG (2000 UT) CAPS Take 1 capsule (2,000 Units total) by mouth daily with breakfast. 90 capsule 3   CRANBERRY EXTRACT PO Take 1 capsule by mouth daily.     losartan (COZAAR) 100 MG tablet TAKE 1 TABLET(100 MG) BY MOUTH DAILY 90 tablet 0   metoprolol succinate (TOPROL-XL) 25 MG 24 hr tablet Take 1 tablet (25 mg total) by mouth daily. 90 tablet 3   No current facility-administered medications on file prior to visit.   Allergies  Allergen Reactions   Actifed Cold-Allergy [Chlorpheniramine-Phenyleph Er] Anaphylaxis    Eyes hurt / headache   Bactrim [Sulfamethoxazole-Trimethoprim] Rash   Social History   Socioeconomic History   Marital status: Married    Spouse name: Not on file   Number of children: Not on file   Years of education: Not on file  Highest education level: Not on file  Occupational History   Not on file  Tobacco Use   Smoking status: Never   Smokeless tobacco: Never  Vaping Use   Vaping Use: Never used  Substance and Sexual Activity   Alcohol use: No   Drug use: No   Sexual activity: Not on file  Other Topics Concern   Not on file  Social History Narrative   Married. HomeMaker.   3 children; 32, 37, 41 y/o   Social Determinants of Radio broadcast assistant Strain: Not on file  Food Insecurity: Not on file  Transportation Needs: Not on file  Physical Activity: Not on file  Stress: Not on file  Social Connections: Not on file  Intimate Partner Violence: Not on file      Review of Systems  All other systems reviewed and are negative.      Objective:   Physical Exam Vitals reviewed.  Constitutional:      Appearance: Normal appearance.  Cardiovascular:     Rate and Rhythm: Normal rate and regular rhythm.     Heart sounds: Normal heart sounds. No murmur heard. Pulmonary:     Effort: Pulmonary effort is normal. No respiratory distress.     Breath sounds: Normal breath sounds. No stridor. No wheezing, rhonchi or rales.  Abdominal:     General: Bowel sounds are normal.     Palpations: Abdomen is soft.  Musculoskeletal:     Cervical back: Normal range of motion.  Neurological:     Mental Status: She is alert.          Assessment & Plan:  Essential hypertension - Plan: CBC with Differential/Platelet, COMPLETE METABOLIC PANEL WITH GFR, Lipid panel  Need for immunization against influenza - Plan: Flu Vaccine QUAD 52mo+IM (Fluarix, Fluzone & Alfiuria Quad PF)  Hypersomnolence  Class 3 severe obesity due to excess calories with serious comorbidity and body mass index (BMI) of 45.0 to 49.9 in adult [facility]) - Plan: Ambulatory referral to Sleep Studies Patient's blood pressure today is elevated.  Increase doxazosin to 4 mg a day and recheck blood pressure in 1 month.  Given her refractory hypertension, and her BMI greater than 49, and her hypersomnolence, I have recommended a referral for a sleep study to evaluate for obstructive sleep apnea which runs in her family.  Also given her obesity, I believe she would be an excellent candidate for a GLP-1 agonist such as [place] or Saxenda.  I will check a CBC CMP and a lipid panel.  If the patient is prediabetic, hopefully we can get her qualified for Ozempic.  If not I would pursue Wegovy along with exercise and diet and lifestyle changes.

## 2020-12-07 ENCOUNTER — Other Ambulatory Visit

## 2020-12-07 LAB — COMPLETE METABOLIC PANEL WITH GFR
AG Ratio: 1.6 (calc) (ref 1.0–2.5)
ALT: 28 U/L (ref 6–29)
AST: 17 U/L (ref 10–35)
Albumin: 4.1 g/dL (ref 3.6–5.1)
Alkaline phosphatase (APISO): 78 U/L (ref 37–153)
BUN: 14 mg/dL (ref 7–25)
CO2: 26 mmol/L (ref 20–32)
Calcium: 8.8 mg/dL (ref 8.6–10.4)
Chloride: 105 mmol/L (ref 98–110)
Creat: 0.77 mg/dL (ref 0.50–1.03)
Globulin: 2.6 g/dL (calc) (ref 1.9–3.7)
Glucose, Bld: 112 mg/dL — ABNORMAL HIGH (ref 65–99)
Potassium: 4.2 mmol/L (ref 3.5–5.3)
Sodium: 139 mmol/L (ref 135–146)
Total Bilirubin: 0.6 mg/dL (ref 0.2–1.2)
Total Protein: 6.7 g/dL (ref 6.1–8.1)
eGFR: 89 mL/min/{1.73_m2} (ref >=60)

## 2020-12-07 LAB — CBC WITH DIFFERENTIAL/PLATELET
Absolute Monocytes: 462 cells/uL (ref 200–950)
Basophils Absolute: 41 cells/uL (ref 0–200)
Basophils Relative: 0.6 %
Eosinophils Absolute: 262 cells/uL (ref 15–500)
Eosinophils Relative: 3.8 %
HCT: 41.5 % (ref 35.0–45.0)
Hemoglobin: 13.8 g/dL (ref 11.7–15.5)
Lymphs Abs: 1387 cells/uL (ref 850–3900)
MCH: 28.9 pg (ref 27.0–33.0)
MCHC: 33.3 g/dL (ref 32.0–36.0)
MCV: 86.8 fL (ref 80.0–100.0)
MPV: 10.5 fL (ref 7.5–12.5)
Monocytes Relative: 6.7 %
Neutro Abs: 4747 cells/uL (ref 1500–7800)
Neutrophils Relative %: 68.8 %
Platelets: 212 10*3/uL (ref 140–400)
RBC: 4.78 10*6/uL (ref 3.80–5.10)
RDW: 15.6 % — ABNORMAL HIGH (ref 11.0–15.0)
Total Lymphocyte: 20.1 %
WBC: 6.9 10*3/uL (ref 3.8–10.8)

## 2020-12-07 LAB — LIPID PANEL
Cholesterol: 124 mg/dL (ref <200)
HDL: 40 mg/dL — ABNORMAL LOW (ref >=50)
LDL Cholesterol (Calc): 62 mg/dL (calc)
Non-HDL Cholesterol (Calc): 84 mg/dL (calc) (ref <130)
Total CHOL/HDL Ratio: 3.1 (calc) (ref <5.0)
Triglycerides: 135 mg/dL (ref <150)

## 2020-12-12 ENCOUNTER — Other Ambulatory Visit: Payer: Self-pay

## 2020-12-12 MED ORDER — OZEMPIC (0.25 OR 0.5 MG/DOSE) 2 MG/1.5ML ~~LOC~~ SOPN
PEN_INJECTOR | SUBCUTANEOUS | 1 refills | Status: DC
Start: 2020-12-12 — End: 2020-12-14

## 2020-12-14 MED ORDER — OZEMPIC (0.25 OR 0.5 MG/DOSE) 2 MG/1.5ML ~~LOC~~ SOPN: PEN_INJECTOR | SUBCUTANEOUS | 1 refills | Status: DC

## 2020-12-14 NOTE — Addendum Note (Signed)
Addended by: Wadie Lessen on: 12/14/2020 08:18 AM   Modules accepted: Orders

## 2020-12-19 ENCOUNTER — Other Ambulatory Visit: Payer: Self-pay | Admitting: Family Medicine

## 2021-01-03 ENCOUNTER — Other Ambulatory Visit: Payer: Self-pay | Admitting: Family Medicine

## 2021-01-23 ENCOUNTER — Other Ambulatory Visit: Payer: Self-pay | Admitting: Family Medicine

## 2021-03-02 ENCOUNTER — Other Ambulatory Visit: Payer: Self-pay | Admitting: Family Medicine

## 2021-03-02 DIAGNOSIS — I10 Essential (primary) hypertension: Secondary | ICD-10-CM

## 2021-03-09 ENCOUNTER — Other Ambulatory Visit: Payer: Self-pay | Admitting: Family Medicine

## 2021-03-20 ENCOUNTER — Encounter: Payer: Self-pay | Admitting: Family Medicine

## 2021-05-08 ENCOUNTER — Ambulatory Visit
Admission: EM | Admit: 2021-05-08 | Discharge: 2021-05-08 | Disposition: A | Discharge status: Home / Self Care | Attending: Family Medicine | Admitting: Family Medicine

## 2021-05-08 DIAGNOSIS — H1031 Unspecified acute conjunctivitis, right eye: Secondary | ICD-10-CM

## 2021-05-08 MED ORDER — OFLOXACIN 0.3 % OP SOLN: 1.0000 [drp] | Freq: Four times a day (QID) | OPHTHALMIC | 0 refills | Status: DC

## 2021-05-08 NOTE — ED Triage Notes (Signed)
Pt states her right eye started having thick mucus and red in color last night  ? ?Pt states for the past week she has had some coughing, sneezing and sore throat ? ?Pt states her right eye is itchy from time to time ? ?Denies Fever ? ? ?

## 2021-05-08 NOTE — ED Provider Notes (Signed)
?[place] ? ? ? ?CSN: [id number] ?Arrival date & time: 05/08/21  1335 ? ? ?  ? ?History   ?Chief Complaint ?Chief Complaint  ?Patient presents with  ? Eye Problem  ? ? ?HPI ?Courtney Hickman is a 61 y.o. female.  ? ?Presenting today with 1 day history of right eye redness, irritation, itching, thick drainage.  Had some cough, sneezing, sore throat about a week ago but those symptoms are resolving well.  So far trying warm compresses to the eye with no relief.  Denies fever, chills, visual changes, nausea, vomiting, headache. ? ? ?Past Medical History:  ?Diagnosis Date  ? Anemia 08/23/2011  ? Dr. Benson Norway: EGD, Colonoscopy, Capsule Endo--all negative Had metrorhagia at that time  ? Arthritis   ? fingers  ? Hearing impairment   ? Heart burn   ? occ  ? Hyperparathyroidism ([place])   ? Hypertension   ? Wears hearing aid in both ears   ? ? ?Patient Active Problem List  ? Diagnosis Date Noted  ? Vitamin D deficiency 11/23/2019  ? Hyperparathyroidism, primary ([place]) 04/22/2018  ? Hypercalcemia 04/08/2018  ? Obesity 04/27/2013  ? Hypertension   ? Hearing impairment   ? ? ?Past Surgical History:  ?Procedure Laterality Date  ? CESAREAN SECTION    ? x2  ? COLONOSCOPY    ? PARATHYROIDECTOMY Right 09/19/2018  ? Procedure: RIGHT PARATHYROIDECTOMY;  Surgeon: Armandina Gemma, MD;  Location: [facility];  Service: General;  Laterality: Right;  ? WISDOM TOOTH EXTRACTION    ? ? ?OB History   ?No obstetric history on file. ?  ? ? ? ?Home Medications   ? ?Prior to Admission medications   ?Medication Sig Start Date End Date Taking? Authorizing Provider  ?ofloxacin (OCUFLOX) 0.3 % ophthalmic solution Place 1 drop into the right eye 4 (four) times daily. 05/08/21  Yes Volney American, PA-C  ?albuterol (VENTOLIN HFA) 108 (90 Base) MCG/ACT inhaler Inhale 1-2 puffs into the lungs every 6 (six) hours as needed for wheezing or shortness of breath. 11/18/20   Jaynee Eagles, PA-C  ?amLODipine (NORVASC) 10 MG tablet TAKE 1 TABLET(10 MG) BY MOUTH DAILY  12/19/20   Susy Frizzle, MD  ?aspirin EC 81 MG tablet Take 81 mg by mouth daily.    [provider]  ?atorvastatin (LIPITOR) 20 MG tablet TAKE 1 TABLET(20 MG) BY MOUTH DAILY 03/09/21   Susy Frizzle, MD  ?Cholecalciferol 50 MCG (2000 UT) CAPS Take 1 capsule (2,000 Units total) by mouth daily with breakfast. 11/23/19   Nida, Marella Chimes, MD  ?Drusilla Kanner EXTRACT PO Take 1 capsule by mouth daily.    [provider]  ?doxazosin (CARDURA) 4 MG tablet Take 1 tablet (4 mg total) by mouth daily. 12/06/20   Susy Frizzle, MD  ?losartan (COZAAR) 100 MG tablet TAKE 1 TABLET(100 MG) BY MOUTH DAILY 03/02/21   Susy Frizzle, MD  ?metoprolol succinate (TOPROL-XL) 25 MG 24 hr tablet TAKE 1 TABLET(25 MG) BY MOUTH DAILY 01/24/21   Susy Frizzle, MD  ?Semaglutide,0.25 or 0.'5MG'$ /DOS, (OZEMPIC, 0.25 OR 0.5 MG/DOSE,) 2 MG/1.5ML SOPN INJECT 0.'5mg'$  WEEKLY FOR 4 WEEKS. INCREASE TO '1mg'$  WEEKLY. 12/14/20   Susy Frizzle, MD  ? ? ?Family History ?Family History  ?Problem Relation Age of Onset  ? Diabetes Mother   ? Hypertension Father   ? Diabetes Brother   ? Hypertension Brother   ? COPD Brother   ? CVA Paternal Grandfather   ? ? ?Social History ?  Social History  ? ?Tobacco Use  ? Smoking status: Never  ? Smokeless tobacco: Never  ?Vaping Use  ? Vaping Use: Never used  ?Substance Use Topics  ? Alcohol use: No  ? Drug use: No  ? ? ? ?Allergies   ?Actifed cold-allergy [chlorpheniramine-phenyleph er] and Bactrim [sulfamethoxazole-trimethoprim] ? ? ?Review of Systems ?Review of Systems ?Per HPI ? ?Physical Exam ?Triage Vital Signs ?ED Triage Vitals  ?Enc Vitals Group  ?   BP 05/08/21 1427 (!) 147/81  ?   Pulse Rate 05/08/21 1427 90  ?   Resp 05/08/21 1427 18  ?   Temp 05/08/21 1427 98 ?F (36.7 ?C)  ?   Temp Source 05/08/21 1427 Oral  ?   SpO2 05/08/21 1427 97 %  ?   Weight --   ?   Height --   ?   Head Circumference --   ?   Peak Flow --   ?   Pain Score 05/08/21 1430 0  ?   Pain Loc --   ?   Pain Edu? --   ?    Excl. in [place]? --   ? ?No data found. ? ?Updated Vital Signs ?BP (!) 147/81 (BP Location: Right Arm)   Pulse 90   Temp 98 ?F (36.7 ?C) (Oral)   Resp 18   SpO2 97%  ? ?Visual Acuity ?Right Eye Distance:   ?Left Eye Distance:   ?Bilateral Distance:   ? ?Right Eye Near:   ?Left Eye Near:    ?Bilateral Near:    ? ?Physical Exam ?Vitals and nursing note reviewed.  ?Constitutional:   ?   Appearance: Normal appearance. She is not ill-appearing.  ?HENT:  ?   Head: Atraumatic.  ?   Nose: Nose normal.  ?   Mouth/Throat:  ?   Mouth: Mucous membranes are moist.  ?   Pharynx: Oropharynx is clear.  ?Eyes:  ?   General:     ?   Right eye: Discharge present.     ?   Left eye: No discharge.  ?   Extraocular Movements: Extraocular movements intact.  ?   Pupils: Pupils are equal, round, and reactive to light.  ?Cardiovascular:  ?   Rate and Rhythm: Normal rate and regular rhythm.  ?   Heart sounds: Normal heart sounds.  ?Pulmonary:  ?   Effort: Pulmonary effort is normal.  ?   Breath sounds: Normal breath sounds.  ?Musculoskeletal:     ?   General: Normal range of motion.  ?   Cervical back: Normal range of motion and neck supple.  ?Skin: ?   General: Skin is warm and dry.  ?Neurological:  ?   Mental Status: She is alert and oriented to person, place, and time.  ?Psychiatric:     ?   Mood and Affect: Mood normal.     ?   Thought Content: Thought content normal.     ?   Judgment: Judgment normal.  ? ? ? ?UC Treatments / Results  ?Labs ?(all labs ordered are listed, but only abnormal results are displayed) ?Labs Reviewed - No data to display ? ?EKG ? ? ?Radiology ?No results found. ? ?Procedures ?Procedures (including critical care time) ? ?Medications Ordered in UC ?Medications - No data to display ? ?Initial Impression / Assessment and Plan / UC Course  ?I have reviewed the triage vital signs and the nursing notes. ? ?Pertinent labs & imaging results that were available during my care  of the patient were reviewed by me and  considered in my medical decision making (see chart for details). ? ?  ? ?Overall vitals and exam reassuring, suggestive of pinkeye of the right eye.  Treat with ofloxacin drops, warm compresses, good hand hygiene.  Return for acutely worsening symptoms.  Visual acuity declined as vision intact per patient. ? ?Final Clinical Impressions(s) / UC Diagnoses  ? ?Final diagnoses:  ?Acute bacterial conjunctivitis of right eye  ? ?Discharge Instructions   ?None ?  ? ?ED Prescriptions   ? ? Medication Sig Dispense Auth. Provider  ? ofloxacin (OCUFLOX) 0.3 % ophthalmic solution Place 1 drop into the right eye 4 (four) times daily. 5 mL Volney American, [place]  ? ?  ? ?PDMP not reviewed this encounter. ?  ?Volney American, PA-C ?05/08/21 1631 ? ?

## 2021-07-27 ENCOUNTER — Telehealth: Payer: Self-pay

## 2021-07-27 NOTE — Telephone Encounter (Signed)
Believes pt has an UTI, due to bladder is never empty, feelings of burning sensation and feeling like a always wanting to urinate all the time.  Pt would like to know if you can call in a Rx?

## 2021-07-28 ENCOUNTER — Other Ambulatory Visit: Payer: Self-pay | Admitting: Family Medicine

## 2021-07-28 MED ORDER — CEPHALEXIN 500 MG PO CAPS: 500.0000 mg | ORAL_CAPSULE | Freq: Three times a day (TID) | ORAL | 0 refills | Status: DC

## 2021-07-28 NOTE — Telephone Encounter (Signed)
Spoke with pt, Rx sent to pharmacy. Pt voiced understanding.

## 2021-10-01 ENCOUNTER — Other Ambulatory Visit: Payer: Self-pay | Admitting: Family Medicine

## 2021-10-27 ENCOUNTER — Other Ambulatory Visit: Payer: Self-pay | Admitting: Family Medicine

## 2021-10-27 NOTE — Telephone Encounter (Signed)
Unable to refill per protocol, Rx request is too soon. Last refill 01/24/21 for 90 and 3 RF. Should have enough until December.E-Prescribing Status: Receipt confirmed by pharmacy (01/24/2021 10:04 AM EST). Will refuse.  Requested Prescriptions  Pending Prescriptions Disp Refills  . metoprolol succinate (TOPROL-XL) 25 MG 24 hr tablet [Pharmacy Med Name: METOPROLOL ER SUCCINATE '25MG'$  TABS] 90 tablet 3    Sig: TAKE 1 TABLET(25 MG) BY MOUTH DAILY     Cardiovascular:  Beta Blockers Failed - 10/27/2021  8:05 AM      Failed - Last BP in normal range    BP Readings from Last 1 Encounters:  05/08/21 (!) 147/81         Failed - Valid encounter within last 6 months    Recent Outpatient Visits          10 months ago Essential hypertension   [place] Susy Frizzle, MD   2 years ago Other chest pain   [place] Dennard Schaumann, Cammie Mcgee, MD   3 years ago Hypercalcemia   [place] Dennard Schaumann, Cammie Mcgee, MD   3 years ago Essential hypertension   [place] Delsa Grana, PA-C   3 years ago Essential hypertension   [place] Delsa Grana, PA-C             Passed - Last Heart Rate in normal range    Pulse Readings from Last 1 Encounters:  05/08/21 90

## 2021-11-22 ENCOUNTER — Other Ambulatory Visit: Payer: Self-pay | Admitting: Gastroenterology

## 2021-11-30 ENCOUNTER — Other Ambulatory Visit: Payer: Self-pay | Admitting: Family Medicine

## 2021-12-05 ENCOUNTER — Other Ambulatory Visit: Payer: Self-pay | Admitting: Family Medicine

## 2021-12-05 DIAGNOSIS — I10 Essential (primary) hypertension: Secondary | ICD-10-CM

## 2021-12-05 NOTE — Telephone Encounter (Signed)
Requested medication (s) are due for refill today:   No  requested too soon  Requested medication (s) are on the active medication list:   Yes  Future visit scheduled:   No   Last ordered: 03/02/2021 #90, 3 refills  Returned because requesting too early to avoid missing doses.   Labs are due   Requested Prescriptions  Pending Prescriptions Disp Refills   losartan (COZAAR) 100 MG tablet [Pharmacy Med Name: LOSARTAN '100MG'$  TABLETS] 90 tablet 3    Sig: TAKE 1 TABLET(100 MG) BY MOUTH DAILY     Cardiovascular:  Angiotensin Receptor Blockers Failed - 12/05/2021  8:05 AM      Failed - Cr in normal range and within 180 days    Creat  Date Value Ref Range Status  12/06/2020 0.77 0.50 - 1.03 mg/dL Final         Failed - K in normal range and within 180 days    Potassium  Date Value Ref Range Status  12/06/2020 4.2 3.5 - 5.3 mmol/L Final         Failed - Last BP in normal range    BP Readings from Last 1 Encounters:  05/08/21 (!) 147/81         Failed - Valid encounter within last 6 months    Recent Outpatient Visits           12 months ago Essential hypertension   [place], Warren T, MD   2 years ago Other chest pain   [place] Dennard Schaumann, Cammie Mcgee, MD   3 years ago Hypercalcemia   [place] Dennard Schaumann, Cammie Mcgee, MD   3 years ago Essential hypertension   [place] Delsa Grana, PA-C   4 years ago Essential hypertension   [place] Delsa Grana, PA-C              Passed - Patient is not pregnant

## 2021-12-19 ENCOUNTER — Encounter (HOSPITAL_COMMUNITY): Payer: Self-pay | Admitting: Gastroenterology

## 2021-12-26 ENCOUNTER — Other Ambulatory Visit: Payer: Self-pay

## 2021-12-26 ENCOUNTER — Ambulatory Visit (HOSPITAL_COMMUNITY)
Admission: RE | Admit: 2021-12-26 | Discharge: 2021-12-26 | Disposition: A | Discharge status: Home / Self Care | Source: Ambulatory Visit | Attending: Gastroenterology | Admitting: Gastroenterology

## 2021-12-26 ENCOUNTER — Ambulatory Visit (HOSPITAL_BASED_OUTPATIENT_CLINIC_OR_DEPARTMENT_OTHER): Admitting: Certified Registered Nurse Anesthetist

## 2021-12-26 ENCOUNTER — Encounter (HOSPITAL_COMMUNITY): Payer: Self-pay | Admitting: Gastroenterology

## 2021-12-26 ENCOUNTER — Ambulatory Visit (HOSPITAL_COMMUNITY): Admitting: Certified Registered Nurse Anesthetist

## 2021-12-26 ENCOUNTER — Encounter (HOSPITAL_COMMUNITY)
Admission: RE | Disposition: A | Discharge status: Home / Self Care | Payer: Self-pay | Source: Ambulatory Visit | Attending: Gastroenterology

## 2021-12-26 DIAGNOSIS — K5730 Diverticulosis of large intestine without perforation or abscess without bleeding: Secondary | ICD-10-CM

## 2021-12-26 DIAGNOSIS — Z1211 Encounter for screening for malignant neoplasm of colon: Secondary | ICD-10-CM | POA: Insufficient documentation

## 2021-12-26 DIAGNOSIS — E213 Hyperparathyroidism, unspecified: Secondary | ICD-10-CM | POA: Diagnosis not present

## 2021-12-26 DIAGNOSIS — Z6841 Body Mass Index (BMI) 40.0 and over, adult: Secondary | ICD-10-CM | POA: Diagnosis not present

## 2021-12-26 DIAGNOSIS — I10 Essential (primary) hypertension: Secondary | ICD-10-CM

## 2021-12-26 DIAGNOSIS — Z79899 Other long term (current) drug therapy: Secondary | ICD-10-CM | POA: Diagnosis not present

## 2021-12-26 HISTORY — PX: COLONOSCOPY WITH PROPOFOL: SHX5780

## 2021-12-26 SURGERY — COLONOSCOPY WITH PROPOFOL
Anesthesia: Monitor Anesthesia Care

## 2021-12-26 MED ORDER — LIDOCAINE 2% (20 MG/ML) 5 ML SYRINGE
INTRAMUSCULAR | Status: DC | PRN
  Administered 2021-12-26: 60 mg via INTRAVENOUS

## 2021-12-26 MED ORDER — ONDANSETRON HCL 4 MG/2ML IJ SOLN
INTRAMUSCULAR | Status: DC | PRN
  Administered 2021-12-26: 4 mg via INTRAVENOUS

## 2021-12-26 MED ORDER — PROPOFOL 1000 MG/100ML IV EMUL
INTRAVENOUS | Status: AC
  Filled 2021-12-26: qty 100

## 2021-12-26 MED ORDER — PROPOFOL 500 MG/50ML IV EMUL
INTRAVENOUS | Status: DC | PRN
  Administered 2021-12-26: 100 ug/kg/min via INTRAVENOUS

## 2021-12-26 MED ORDER — LACTATED RINGERS IV SOLN: INTRAVENOUS | Status: DC

## 2021-12-26 MED ORDER — SODIUM CHLORIDE 0.9 % IV SOLN: INTRAVENOUS | Status: DC

## 2021-12-26 MED ORDER — PROPOFOL 10 MG/ML IV BOLUS
INTRAVENOUS | Status: DC | PRN
  Administered 2021-12-26: 50 mg via INTRAVENOUS

## 2021-12-26 SURGICAL SUPPLY — 22 items

## 2021-12-26 NOTE — Anesthesia Preprocedure Evaluation (Signed)
Anesthesia Evaluation  Patient identified by MRN, date of birth, ID band Patient awake    Reviewed: Allergy & Precautions, H&P , NPO status , Patient's Chart, lab work & pertinent test results  Airway Mallampati: II  TM Distance: >3 FB Neck ROM: Full    Dental no notable dental hx.    Pulmonary neg pulmonary ROS   Pulmonary exam normal breath sounds clear to auscultation       Cardiovascular hypertension, Pt. on medications Normal cardiovascular exam Rhythm:Regular Rate:Normal     Neuro/Psych negative neurological ROS  negative psych ROS   GI/Hepatic negative GI ROS, Neg liver ROS,,,  Endo/Other    Morbid obesityHyperparathyroidism, primary  Renal/GU negative Renal ROS  negative genitourinary   Musculoskeletal negative musculoskeletal ROS (+)    Abdominal   Peds negative pediatric ROS (+)  Hematology negative hematology ROS (+)   Anesthesia Other Findings   Reproductive/Obstetrics negative OB ROS                             Anesthesia Physical Anesthesia Plan  ASA: 2  Anesthesia Plan: MAC   Post-op Pain Management:    Induction: Intravenous  PONV Risk Score and Plan: 2 and Propofol infusion and Treatment may vary due to age or medical condition  Airway Management Planned: Simple Face Mask  Additional Equipment:   Intra-op Plan:   Post-operative Plan:   Informed Consent: I have reviewed the patients History and Physical, chart, labs and discussed the procedure including the risks, benefits and alternatives for the proposed anesthesia with the patient or authorized representative who has indicated his/her understanding and acceptance.     Dental advisory given  Plan Discussed with: CRNA and Surgeon  Anesthesia Plan Comments:        Anesthesia Quick Evaluation

## 2021-12-26 NOTE — Anesthesia Procedure Notes (Signed)
Procedure Name: MAC Date/Time: 12/26/2021 1:46 PM  Performed by: Deliah Boston, CRNAPre-anesthesia Checklist: Patient identified, Emergency Drugs available, Suction available and Patient being monitored Patient Re-evaluated:Patient Re-evaluated prior to induction Oxygen Delivery Method: Simple face mask Placement Confirmation: breath sounds checked- equal and bilateral and positive ETCO2 Dental Injury: Teeth and Oropharynx as per pre-operative assessment

## 2021-12-26 NOTE — Transfer of Care (Signed)
Immediate Anesthesia Transfer of Care Note  Patient: Courtney Hickman  Procedure(s) Performed: Procedure(s): COLONOSCOPY WITH PROPOFOL (N/A)  Patient Location: PACU  Anesthesia Type:MAC  Level of Consciousness: Patient easily awoken, sedated, comfortable, cooperative, following commands, responds to stimulation.   Airway & Oxygen Therapy: Patient spontaneously breathing, ventilating well, oxygen via simple oxygen mask.  Post-op Assessment: Report given to PACU RN, vital signs reviewed and stable, moving all extremities.   Post vital signs: Reviewed and stable.  Complications: No apparent anesthesia complications Last Vitals:  Vitals Value Taken Time  BP    Temp    Pulse 87 12/26/21 1407  Resp 20 12/26/21 1407  SpO2 98 % 12/26/21 1407  Vitals shown include unvalidated device data.  Last Pain:  Vitals:   12/26/21 1313  TempSrc: Temporal         Complications: No notable events documented.

## 2021-12-26 NOTE — H&P (Signed)
Courtney Hickman HPI: At this time the patient denies any problems with nausea, vomiting, fevers, chills, abdominal pain, constipation, hematochezia, melena, GERD, or dysphagia. The patient denies any known family history of colon cancers. No complaints of chest pain, MI, or sleep apnea.  Recently she suffered with diarrhea, but other family members had the diarrhea.  This was consistent with an infectious source.  She does complain about some SOB, but she associates that with her obesity and deconditioning.  She had a normal colonoscopy in 2013.   Past Medical History:  Diagnosis Date   Anemia 08/23/2011   Dr. Benson Norway: EGD, Colonoscopy, Capsule Endo--all negative Had metrorhagia at that time   Arthritis    fingers   Hearing impairment    Heart burn    occ   Hyperparathyroidism ([place])    Hypertension    Wears hearing aid in both ears     Past Surgical History:  Procedure Laterality Date   CESAREAN SECTION     x2   COLONOSCOPY     PARATHYROIDECTOMY Right 09/19/2018   Procedure: RIGHT PARATHYROIDECTOMY;  Surgeon: Armandina Gemma, MD;  Location: [facility];  Service: General;  Laterality: Right;   WISDOM TOOTH EXTRACTION      Family History  Problem Relation Age of Onset   Diabetes Mother    Hypertension Father    Diabetes Brother    Hypertension Brother    COPD Brother    CVA Paternal Grandfather     Social History:  reports that she has never smoked. She has never used smokeless tobacco. She reports that she does not drink alcohol and does not use drugs.  Allergies:  Allergies  Allergen Reactions   Actifed Cold-Allergy [Chlorpheniramine-Phenyleph Er] Anaphylaxis    Eyes hurt / headache   Bactrim [Sulfamethoxazole-Trimethoprim] Rash    Medications: Scheduled: Continuous:  sodium chloride     lactated ringers 10 mL/hr at 12/26/21 1324    No results found for this or any previous visit (from the past 24 hour(s)).   No results found.  ROS:  As stated above in the HPI otherwise  negative.  Blood pressure (!) 163/91, pulse 95, temperature 98 F (36.7 C), temperature source Temporal, resp. rate (!) 24, height 5' 0.98" (1.549 m), weight 117.9 kg, SpO2 97 %.    PE: Gen: NAD, Alert and Oriented HEENT:  [place]/AT, EOMI Neck: Supple, no LAD Lungs: CTA Bilaterally CV: RRR without M/G/R ABD: Soft, NTND, +BS Ext: No C/C/E  Assessment/Plan: 1) Screening colonoscopy.  Courtney Hickman D 12/26/2021, 1:37 PM

## 2021-12-26 NOTE — Op Note (Signed)
[facility] Patient Name: Courtney Hickman Procedure Date: 12/26/2021 MRN: [id number] Attending MD: Carol Ada , MD, [id number] Date of Birth: 09/07/1960 CSN: [id number] Age: 61 Admit Type: Outpatient Procedure:                Colonoscopy Indications:              Screening for colorectal malignant neoplasm Providers:                Carol Ada, MD, Dulcy Fanny, Fransico Setters                            Mbumina, Technician Referring MD:              Medicines:                Propofol per Anesthesia Complications:            No immediate complications. Estimated Blood Loss:     Estimated blood loss: none. Procedure:                Pre-Anesthesia Assessment:                           - Prior to the procedure, a History and Physical                            was performed, and patient medications and                            allergies were reviewed. The patient's tolerance of                            previous anesthesia was also reviewed. The risks                            and benefits of the procedure and the sedation                            options and risks were discussed with the patient.                            All questions were answered, and informed consent                            was obtained. Prior Anticoagulants: The patient has                            taken no anticoagulant or antiplatelet agents. ASA                            Grade Assessment: III - A patient with severe                            systemic disease. After reviewing the risks and  benefits, the patient was deemed in satisfactory                            condition to undergo the procedure.                           - Sedation was administered by an anesthesia                            professional. Deep sedation was attained.                           After obtaining informed consent, the colonoscope                            was passed under  direct vision. Throughout the                            procedure, the patient's blood pressure, pulse, and                            oxygen saturations were monitored continuously. The                            CF-HQ190L ([id number] Olympus colonoscope was                            introduced through the anus and advanced to the the                            cecum, identified by appendiceal orifice and                            ileocecal valve. The colonoscopy was performed                            without difficulty. The patient tolerated the                            procedure well. The quality of the bowel                            preparation was evaluated using the BBPS [facility]                            Bowel Preparation Scale) with scores of: Right                            Colon = 3, Transverse Colon = 3 and Left Colon = 3                            (entire mucosa seen well with no residual staining,  small fragments of stool or opaque liquid). The                            total BBPS score equals 9. The ileocecal valve,                            appendiceal orifice, and rectum were photographed. Scope In: 1:52:09 PM Scope Out: 2:01:04 PM Scope Withdrawal Time: 0 hours 7 minutes 41 seconds  Total Procedure Duration: 0 hours 8 minutes 55 seconds  Findings:      Scattered medium-mouthed diverticula were found in the sigmoid colon. Impression:               - Diverticulosis in the sigmoid colon.                           - No specimens collected. Moderate Sedation:      Not Applicable - Patient had care per Anesthesia. Recommendation:           - Patient has a contact number available for                            emergencies. The signs and symptoms of potential                            delayed complications were discussed with the                            patient. Return to normal activities tomorrow.                            Written  discharge instructions were provided to the                            patient.                           - Resume previous diet.                           - Continue present medications.                           - Repeat colonoscopy in 10 years for screening                            purposes. Procedure Code(s):        --- Professional ---                           [phone], Colonoscopy, flexible; diagnostic, including                            collection of specimen(s) by brushing or washing,                            when performed (separate procedure) Diagnosis Code(s):        ---  Professional ---                           Z12.11, Encounter for screening for malignant                            neoplasm of colon                           K57.30, Diverticulosis of large intestine without                            perforation or abscess without bleeding CPT copyright 2022 American [employer] Association. All rights reserved. The codes documented in this report are preliminary and upon coder review may  be revised to meet current compliance requirements. Carol Ada, MD Carol Ada, MD 12/26/2021 2:13:46 PM This report has been signed electronically. Number of Addenda: 0

## 2021-12-26 NOTE — Discharge Instructions (Signed)

## 2021-12-28 ENCOUNTER — Encounter (HOSPITAL_COMMUNITY): Payer: Self-pay | Admitting: Gastroenterology

## 2021-12-28 NOTE — Anesthesia Postprocedure Evaluation (Signed)
Anesthesia Post Note  Patient: Courtney Hickman  Procedure(s) Performed: COLONOSCOPY WITH PROPOFOL     Patient location during evaluation: PACU Anesthesia Type: MAC Level of consciousness: awake and alert Pain management: pain level controlled Vital Signs Assessment: post-procedure vital signs reviewed and stable Respiratory status: spontaneous breathing Cardiovascular status: stable Anesthetic complications: no   No notable events documented.  Last Vitals:  Vitals:   12/26/21 1428 12/26/21 1430  BP: (!) 155/81 (!) 155/81  Pulse:  87  Resp:  (!) 22  Temp:    SpO2: 94% 94%    Last Pain:  Vitals:   12/26/21 1430  TempSrc:   PainSc: 0-No pain                 Nolon Nations

## 2022-02-05 ENCOUNTER — Encounter: Payer: Self-pay | Admitting: Family Medicine

## 2022-02-05 ENCOUNTER — Ambulatory Visit (INDEPENDENT_AMBULATORY_CARE_PROVIDER_SITE_OTHER): Admitting: Family Medicine

## 2022-02-05 VITALS — BP 182/110 | HR 94 | Ht 60.75 in | Wt 264.0 lb

## 2022-02-05 DIAGNOSIS — H905 Unspecified sensorineural hearing loss: Secondary | ICD-10-CM | POA: Insufficient documentation

## 2022-02-05 DIAGNOSIS — G4710 Hypersomnia, unspecified: Secondary | ICD-10-CM

## 2022-02-05 DIAGNOSIS — D500 Iron deficiency anemia secondary to blood loss (chronic): Secondary | ICD-10-CM | POA: Insufficient documentation

## 2022-02-05 DIAGNOSIS — I10 Essential (primary) hypertension: Secondary | ICD-10-CM

## 2022-02-05 MED ORDER — METOPROLOL SUCCINATE ER 25 MG PO TB24: ORAL_TABLET | ORAL | 3 refills | Status: DC

## 2022-02-05 MED ORDER — DOXAZOSIN MESYLATE 4 MG PO TABS: ORAL_TABLET | ORAL | 3 refills | Status: DC

## 2022-02-05 MED ORDER — LOSARTAN POTASSIUM 100 MG PO TABS: ORAL_TABLET | ORAL | 3 refills | Status: DC

## 2022-02-05 MED ORDER — AMLODIPINE BESYLATE 10 MG PO TABS: ORAL_TABLET | ORAL | 3 refills | Status: DC

## 2022-02-05 NOTE — Progress Notes (Signed)
Subjective:    Patient ID: Courtney Hickman, female    DOB: 1960/07/03, 62 y.o.   MRN: [id number]  HPI Patient presents today for a follow-up of her hypertension.  Blood pressure was extremely high today.  She has been out of her metoprolol and amlodipine.  The amlodipine she stopped taking a month ago.  She also reports severe fatigue, lack of energy, and hypersomnolence.  Due to her elevated blood pressure I recommended a sleep study at her last visit.  She never saw the doctors or followed up with this she also is dealing with morbid obesity.  Her BMI is over 50 Past Medical History:  Diagnosis Date   Anemia 08/23/2011   Dr. Benson [place]: EGD, Colonoscopy, Capsule Endo--all negative Had metrorhagia at that time   Arthritis    fingers   Hearing impairment    Heart burn    occ   Hyperparathyroidism ([place])    Hypertension    Wears hearing aid in both ears    Patient is hearing impaired.  This is been since birth.  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 Past Surgical History:  Procedure Laterality Date   CESAREAN SECTION     x2   COLONOSCOPY     COLONOSCOPY WITH PROPOFOL N/A 12/26/2021   Procedure: COLONOSCOPY WITH PROPOFOL;  Surgeon: Carol Ada, MD;  Location: [facility] END[facility];  Service: Gastroenterology;  Laterality: N/A;   PARATHYROIDECTOMY Right 09/19/2018   Procedure: RIGHT PARATHYROIDECTOMY;  Surgeon: Armandina Gemma, MD;  Location: [facility] ORS;  Service: General;  Laterality: Right;   WISDOM TOOTH EXTRACTION     Current Outpatient Medications on File Prior to Visit  Medication Sig Dispense Refill   albuterol (VENTOLIN HFA) 108 (90 Base) MCG/ACT inhaler Inhale 1-2 puffs into the lungs every 6 (six) hours as needed for wheezing or shortness of breath. 18 g 0   amLODipine (NORVASC) 10 MG tablet TAKE 1 TABLET(10 MG) BY MOUTH DAILY 90 tablet 3   aspirin EC 81 MG tablet Take 81 mg by mouth  daily.     atorvastatin (LIPITOR) 20 MG tablet TAKE 1 TABLET(20 MG) BY MOUTH DAILY (Patient not taking: Reported on 12/21/2021) 90 tablet 3   cephALEXin (KEFLEX) 500 MG capsule Take 1 capsule (500 mg total) by mouth 3 (three) times daily. (Patient not taking: Reported on 12/21/2021) 15 capsule 0   Cholecalciferol 50 MCG (2000 UT) CAPS Take 1 capsule (2,000 Units total) by mouth daily with breakfast. 90 capsule 3   COLLAGEN PO Take 1 capsule by mouth daily.     CRANBERRY EXTRACT PO Take 1 capsule by mouth daily.     doxazosin (CARDURA) 4 MG tablet TAKE 1 TABLET(4 MG) BY MOUTH DAILY 90 tablet 3   KRILL OIL PO Take 1 capsule by mouth daily.     losartan (COZAAR) 100 MG tablet TAKE 1 TABLET(100 MG) BY MOUTH DAILY 90 tablet 3   metoprolol succinate (TOPROL-XL) 25 MG 24 hr tablet TAKE 1 TABLET(25 MG) BY MOUTH DAILY 90 tablet 3   ofloxacin (OCUFLOX) 0.3 % ophthalmic solution Place 1 drop into the right eye 4 (four) times daily. (Patient not taking: Reported on 12/21/2021) 5 mL 0   Semaglutide,0.25 or 0.'5MG'$ /DOS, (OZEMPIC, 0.25 OR 0.5 MG/DOSE,) 2 MG/1.5ML SOPN INJECT 0.'5mg'$  WEEKLY FOR 4 WEEKS. INCREASE TO '1mg'$  WEEKLY. (Patient not taking: Reported on 12/19/2021) 3 mL 1  No current facility-administered medications on file prior to visit.   Allergies  Allergen Reactions   Actifed Cold-Allergy [Chlorpheniramine-Phenyleph Er] Anaphylaxis    Eyes hurt / headache   Bactrim [Sulfamethoxazole-Trimethoprim] Rash   Social History   Socioeconomic History   Marital status: Married    Spouse name: Not on file   Number of children: Not on file   Years of education: Not on file   Highest education level: Not on file  Occupational History   Not on file  Tobacco Use   Smoking status: Never   Smokeless tobacco: Never  Vaping Use   Vaping Use: Never used  Substance and Sexual Activity   Alcohol use: No   Drug use: No   Sexual activity: Not Currently    Birth control/protection: None  Other Topics  Concern   Not on file  Social History Narrative   Married. HomeMaker.   3 children; 75, 53, 10 y/o   Social Determinants of Radio broadcast assistant Strain: Not on file  Food Insecurity: Not on file  Transportation Needs: Not on file  Physical Activity: Not on file  Stress: Not on file  Social Connections: Not on file  Intimate Partner Violence: Not on file      Review of Systems  All other systems reviewed and are negative.      Objective:   Physical Exam Vitals reviewed.  Constitutional:      Appearance: Normal appearance.  Cardiovascular:     Rate and Rhythm: Normal rate and regular rhythm.     Heart sounds: Normal heart sounds. No murmur heard. Pulmonary:     Effort: Pulmonary effort is normal. No respiratory distress.     Breath sounds: Normal breath sounds. No stridor. No wheezing, rhonchi or rales.  Abdominal:     General: Bowel sounds are normal.     Palpations: Abdomen is soft.  Musculoskeletal:     Cervical back: Normal range of motion.  Neurological:     Mental Status: She is alert.           Assessment & Plan:  Essential hypertension - Plan: CBC with Differential/Platelet, COMPLETE METABOLIC PANEL WITH GFR, Lipid panel, losartan (COZAAR) 100 MG tablet, Ambulatory referral to Sleep Studies, Amb ref to Medical Nutrition Therapy-MNT Resume all of her blood pressure medication and recheck blood pressure in 1 week.  If still elevated at that point, we will need to consider adding clonidine.  Schedule this sleep study to evaluate for sleep apnea as a contributor to her refractory hypertension and as a potential cause of her hypersomnolence and fatigue.  Consult nutrition therapy due to her morbid obesity as her insurance will not cover a GLP-1 agonist.

## 2022-02-06 LAB — LIPID PANEL
Cholesterol: 184 mg/dL (ref <200)
HDL: 43 mg/dL — ABNORMAL LOW (ref >=50)
LDL Cholesterol (Calc): 110 mg/dL (calc) — ABNORMAL HIGH
Non-HDL Cholesterol (Calc): 141 mg/dL (calc) — ABNORMAL HIGH (ref <130)
Total CHOL/HDL Ratio: 4.3 (calc) (ref <5.0)
Triglycerides: 192 mg/dL — ABNORMAL HIGH (ref <150)

## 2022-02-06 LAB — CBC WITH DIFFERENTIAL/PLATELET
Absolute Monocytes: 450 cells/uL (ref 200–950)
Basophils Absolute: 53 cells/uL (ref 0–200)
Basophils Relative: 0.7 %
Eosinophils Absolute: 248 cells/uL (ref 15–500)
Eosinophils Relative: 3.3 %
HCT: 45.8 % — ABNORMAL HIGH (ref 35.0–45.0)
Hemoglobin: 15.4 g/dL (ref 11.7–15.5)
Lymphs Abs: 1455 cells/uL (ref 850–3900)
MCH: 28.5 pg (ref 27.0–33.0)
MCHC: 33.6 g/dL (ref 32.0–36.0)
MCV: 84.8 fL (ref 80.0–100.0)
MPV: 11.1 fL (ref 7.5–12.5)
Monocytes Relative: 6 %
Neutro Abs: 5295 cells/uL (ref 1500–7800)
Neutrophils Relative %: 70.6 %
Platelets: 249 10*3/uL (ref 140–400)
RBC: 5.4 10*6/uL — ABNORMAL HIGH (ref 3.80–5.10)
RDW: 15.5 % — ABNORMAL HIGH (ref 11.0–15.0)
Total Lymphocyte: 19.4 %
WBC: 7.5 10*3/uL (ref 3.8–10.8)

## 2022-02-06 LAB — COMPLETE METABOLIC PANEL WITH GFR
AG Ratio: 1.4 (calc) (ref 1.0–2.5)
ALT: 25 U/L (ref 6–29)
AST: 20 U/L (ref 10–35)
Albumin: 4.6 g/dL (ref 3.6–5.1)
Alkaline phosphatase (APISO): 76 U/L (ref 37–153)
BUN: 14 mg/dL (ref 7–25)
CO2: 19 mmol/L — ABNORMAL LOW (ref 20–32)
Calcium: 9.7 mg/dL (ref 8.6–10.4)
Chloride: 109 mmol/L (ref 98–110)
Creat: 0.82 mg/dL (ref 0.50–1.05)
Globulin: 3.4 g/dL (calc) (ref 1.9–3.7)
Glucose, Bld: 84 mg/dL (ref 65–99)
Potassium: 4.1 mmol/L (ref 3.5–5.3)
Sodium: 145 mmol/L (ref 135–146)
Total Bilirubin: 0.6 mg/dL (ref 0.2–1.2)
Total Protein: 8 g/dL (ref 6.1–8.1)
eGFR: 82 mL/min/{1.73_m2} (ref >=60)

## 2022-02-12 ENCOUNTER — Encounter: Payer: Self-pay | Admitting: Family Medicine

## 2022-02-12 ENCOUNTER — Other Ambulatory Visit: Payer: Self-pay | Admitting: Family Medicine

## 2022-02-12 ENCOUNTER — Ambulatory Visit (INDEPENDENT_AMBULATORY_CARE_PROVIDER_SITE_OTHER): Admitting: Family Medicine

## 2022-02-12 VITALS — BP 132/76 | HR 87 | Temp 98.6°F | Ht 60.75 in | Wt 263.0 lb

## 2022-02-12 DIAGNOSIS — I10 Essential (primary) hypertension: Secondary | ICD-10-CM | POA: Diagnosis not present

## 2022-02-12 DIAGNOSIS — Z1231 Encounter for screening mammogram for malignant neoplasm of breast: Secondary | ICD-10-CM

## 2022-02-12 NOTE — Progress Notes (Signed)
Subjective:    Patient ID: Courtney Hickman, female    DOB: March 04, 1960, 62 y.o.   MRN: [id number]  HPI Patient presents today for a follow-up of her hypertension.  Blood pressure was extremely high today.  She has been out of her metoprolol and amlodipine.  The amlodipine she stopped taking a month ago.  She also reports severe fatigue, lack of energy, and hypersomnolence.  Due to her elevated blood pressure I recommended a sleep study at her last visit.  She never saw the doctors or followed up with this she also is dealing with morbid obesity.  Her BMI is over 50.  At that  time, ,my plan was: Resume all of her blood pressure medication and recheck blood pressure in 1 week.  If still elevated at that point, we will need to consider adding clonidine.  Schedule this sleep study to evaluate for sleep apnea as a contributor to her refractory hypertension and as a potential cause of her hypersomnolence and fatigue.  Consult nutrition therapy due to her morbid obesity as her insurance will not cover a GLP-1 agonist.  02/12/22 Labs showed a decreased bicarbonate suggesting acidosis along with an elevated hematocrit.  I believe this is all likely respiratory due to hypercapnia and possible sleep apnea.  She is tolerating her blood pressure medicine today with no dizziness, chest pain, headache, palpitations.  She is already heard from the nutritionist that she has not yet heard from the referral for a sleep study Past Medical History:  Diagnosis Date   Anemia 08/23/2011   Dr. Benson [place]: EGD, Colonoscopy, Capsule Endo--all negative Had metrorhagia at that time   Arthritis    fingers   Hearing impairment    Heart burn    occ   Hyperparathyroidism ([place])    Hypertension    Wears hearing aid in both ears    Patient is hearing impaired.  This is been since birth.  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 Past Surgical History:  Procedure Laterality Date   CESAREAN SECTION     x2   COLONOSCOPY     COLONOSCOPY WITH PROPOFOL N/A 12/26/2021   Procedure: COLONOSCOPY WITH PROPOFOL;  Surgeon: Carol Ada, MD;  Location: [facility];  Service: Gastroenterology;  Laterality: N/A;   PARATHYROIDECTOMY Right 09/19/2018   Procedure: RIGHT PARATHYROIDECTOMY;  Surgeon: Armandina Gemma, MD;  Location: [facility];  Service: General;  Laterality: Right;   WISDOM TOOTH EXTRACTION     Current Outpatient Medications on File Prior to Visit  Medication Sig Dispense Refill   albuterol (VENTOLIN HFA) 108 (90 Base) MCG/ACT inhaler Inhale 1-2 puffs into the lungs every 6 (six) hours as needed for wheezing or shortness of breath. 18 g 0   amLODipine (NORVASC) 10 MG tablet TAKE 1 TABLET(10 MG) BY MOUTH DAILY 90 tablet 3   aspirin EC 81 MG tablet Take 81 mg by mouth daily.     Cholecalciferol 50 MCG (2000 UT) CAPS Take 1 capsule (2,000 Units total) by mouth daily with breakfast. 90 capsule 3   COLLAGEN PO Take 1 capsule by mouth daily.     CRANBERRY EXTRACT PO Take 1 capsule by mouth daily.     doxazosin (CARDURA) 4 MG tablet TAKE 1 TABLET(4 MG) BY MOUTH DAILY 90 tablet 3   KRILL OIL PO Take 1 capsule by mouth daily.  losartan (COZAAR) 100 MG tablet TAKE 1 TABLET(100 MG) BY MOUTH DAILY 90 tablet 3   metoprolol succinate (TOPROL-XL) 25 MG 24 hr tablet TAKE 1 TABLET(25 MG) BY MOUTH DAILY 90 tablet 3   No current facility-administered medications on file prior to visit.   Allergies  Allergen Reactions   Actifed Cold-Allergy [Chlorpheniramine-Phenyleph Er] Anaphylaxis    Eyes hurt / headache   Bactrim [Sulfamethoxazole-Trimethoprim] Rash   Social History   Socioeconomic History   Marital status: Married    Spouse name: Not on file   Number of children: Not on file   Years of education: Not on file   Highest education level: Not on file  Occupational History   Not on file  Tobacco Use   Smoking status:  Never   Smokeless tobacco: Never  Vaping Use   Vaping Use: Never used  Substance and Sexual Activity   Alcohol use: No   Drug use: No   Sexual activity: Not Currently    Birth control/protection: None  Other Topics Concern   Not on file  Social History Narrative   Married. HomeMaker.   3 children; 75, 86, 11 y/o   Social Determinants of Radio broadcast assistant Strain: Not on file  Food Insecurity: Not on file  Transportation Needs: Not on file  Physical Activity: Not on file  Stress: Not on file  Social Connections: Not on file  Intimate Partner Violence: Not on file      Review of Systems  All other systems reviewed and are negative.      Objective:   Physical Exam Vitals reviewed.  Constitutional:      Appearance: Normal appearance.  Cardiovascular:     Rate and Rhythm: Normal rate and regular rhythm.     Heart sounds: Normal heart sounds. No murmur heard. Pulmonary:     Effort: Pulmonary effort is normal. No respiratory distress.     Breath sounds: Normal breath sounds. No stridor. No wheezing, rhonchi or rales.  Abdominal:     General: Bowel sounds are normal.     Palpations: Abdomen is soft.  Musculoskeletal:     Cervical back: Normal range of motion.  Neurological:     Mental Status: She is alert.           Assessment & Plan:  Benign essential HTN - Plan: Ambulatory referral to Sleep Studies I am very happy with her blood pressure today.  I will place a referral for sleep study as I believe her elevated BMI is likely causing sleep apnea.  She certainly has hypersomnolence and fatigue.  I believe this is contributing to her blood pressure and also her acidosis

## 2022-02-14 ENCOUNTER — Encounter

## 2022-02-14 ENCOUNTER — Ambulatory Visit
Admission: RE | Admit: 2022-02-14 | Discharge: 2022-02-14 | Disposition: A | Discharge status: Home / Self Care | Source: Ambulatory Visit | Attending: Family Medicine | Admitting: Family Medicine

## 2022-02-14 DIAGNOSIS — Z1231 Encounter for screening mammogram for malignant neoplasm of breast: Secondary | ICD-10-CM

## 2022-02-19 ENCOUNTER — Other Ambulatory Visit: Payer: Self-pay | Admitting: Family Medicine

## 2022-02-19 DIAGNOSIS — R928 Other abnormal and inconclusive findings on diagnostic imaging of breast: Secondary | ICD-10-CM

## 2022-02-23 ENCOUNTER — Ambulatory Visit
Admission: RE | Admit: 2022-02-23 | Discharge: 2022-02-23 | Disposition: A | Discharge status: Home / Self Care | Source: Ambulatory Visit | Attending: Family Medicine | Admitting: Family Medicine

## 2022-02-23 DIAGNOSIS — R928 Other abnormal and inconclusive findings on diagnostic imaging of breast: Secondary | ICD-10-CM

## 2022-03-01 ENCOUNTER — Encounter: Attending: Family Medicine | Admitting: Nutrition

## 2022-03-01 NOTE — Progress Notes (Signed)
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 Preferred learning style: Hands on  Learning readiness: Ready    NUTRITION ASSESSMENT  62 yr old female referred for HTN and Obesity. BMI 52.   Diet is high in processed foods and inconsistent to meet his nutritional needs. Her diet is high in calories in processed foods but low in fresh fruits, vegetables and whole grains.  BP 150/78 mg/dl.  155/81 mg/dl. At MD office.   Anthropometrics  Wt Readings from Last 3 Encounters:  03/01/22 267 lb (121.1 kg)  02/12/22 263 lb (119.3 kg)  02/05/22 264 lb (119.7 kg)   Ht Readings from Last 3 Encounters:  03/01/22 5' (1.524 m)  02/12/22 5' 0.75" (1.543 m)  02/05/22 5' 0.75" (1.543 m)   Body mass index is 52.14 kg/m. '@BMIFA'$ @ Facility age limit for growth %iles is 20 years. Facility age limit for growth %iles is 20 years.    Clinical Medical Hx: see chart Medications: see chart Labs:  This SmartLink has not been configured with any valid records.      Latest Ref Rng & Units 02/05/2022    2:23 PM 12/06/2020    9:24 AM 11/16/2020    2:00 PM  CMP  Glucose 65 - 99 mg/dL 84  112    BUN 7 - 25 mg/dL 14  14    Creatinine 0.50 - 1.05 mg/dL 0.82  0.77    Sodium 135 - 146 mmol/L 145  139    Potassium 3.5 - 5.3 mmol/L 4.1  4.2    Chloride 98 - 110 mmol/L 109  105    CO2 20 - 32 mmol/L 19  26    Calcium 8.6 - 10.4 mg/dL 9.7  8.8  9.0   Total Protein 6.1 - 8.1 g/dL 8.0  6.7    Total Bilirubin 0.2 - 1.2 mg/dL 0.6  0.6    AST 10 - 35 U/L 20  17    ALT 6 - 29 U/L 25  28     Lipid Panel     Component Value Date/Time   CHOL 184 02/05/2022 1423   TRIG 192 (H) 02/05/2022 1423   HDL 43 (L) 02/05/2022 1423   CHOLHDL 4.3 02/05/2022 1423   VLDL 36 05/17/2014 1110   LDLCALC 110 (H) 02/05/2022 1423    Notable Signs/Symptoms: Tired  Lifestyle & Dietary Hx Married. She is hard  of hearing. She cooks and shops for meals. Has been working on eating more at home. Eats 2-3 meals per day  Estimated daily fluid intake: 64 oz Supplements: Cranberry pills, VIt D, Collagen, Krill OIl,  Sleep: 5-6 Stress / self-care: no  Current average weekly physical activity: ADL  24-Hr Dietary Recall First Meal: Coffee-creamer and stevia, Sausage biscuit, Water Snack:  Second Meal: sometimes skips. Snack:  pb crackers or chips Third Meal: Chicken teriyaki , rice and chinese vegetables and crab ragoon, water     1/2 of it  Snack: Misc cookies  Beverages: water  Estimated Energy Needs Calories: 1200 Carbohydrate: 135g Protein: 90g Fat: 33g   NUTRITION DIAGNOSIS  NI-5.11.2 Predicted excessive nutrient intake As related to high salt and high calorie.  As evidenced by HTN and Obesity BMI > 40.   NUTRITION INTERVENTION  Nutrition education (E-1) on the following topics:  Low Salt Diet Whole Plant based foods  Handouts Provided Include  Low Salt Diet Lifestyle Medicine  Learning Style & Readiness for Change Teaching method utilized: Visual & Auditory  Demonstrated degree of understanding via: Teach Back  Barriers to learning/adherence to lifestyle change: None  Goals Established by Pt Goals  Cut down on cheese Cut down on beef Increase vegetables Go to the gym 3 times per week   MONITORING & EVALUATION Dietary intake, weekly physical activity, and weight in 1-2 months..  Next Steps  Patient is to work on cutting down on high sodium foods and focus on more vegetables.Marland Kitchen

## 2022-03-01 NOTE — Patient Instructions (Signed)
Goals  Cut down on cheese Cut down on beef Increase vegetables Go to the gym 3 times per week

## 2022-03-05 ENCOUNTER — Other Ambulatory Visit: Payer: Self-pay | Admitting: Family Medicine

## 2022-03-05 DIAGNOSIS — Z1231 Encounter for screening mammogram for malignant neoplasm of breast: Secondary | ICD-10-CM

## 2022-03-15 ENCOUNTER — Encounter: Payer: Self-pay | Admitting: Neurology

## 2022-03-15 ENCOUNTER — Ambulatory Visit (INDEPENDENT_AMBULATORY_CARE_PROVIDER_SITE_OTHER): Admitting: Neurology

## 2022-03-15 VITALS — BP 151/79 | HR 77 | Ht 61.0 in | Wt 263.4 lb

## 2022-03-15 DIAGNOSIS — R0683 Snoring: Secondary | ICD-10-CM

## 2022-03-15 DIAGNOSIS — Z6841 Body Mass Index (BMI) 40.0 and over, adult: Secondary | ICD-10-CM

## 2022-03-15 DIAGNOSIS — R030 Elevated blood-pressure reading, without diagnosis of hypertension: Secondary | ICD-10-CM | POA: Diagnosis not present

## 2022-03-15 DIAGNOSIS — Z9189 Other specified personal risk factors, not elsewhere classified: Secondary | ICD-10-CM | POA: Diagnosis not present

## 2022-03-15 DIAGNOSIS — G4719 Other hypersomnia: Secondary | ICD-10-CM | POA: Diagnosis not present

## 2022-03-15 DIAGNOSIS — Z820 Family history of epilepsy and other diseases of the nervous system: Secondary | ICD-10-CM

## 2022-03-15 DIAGNOSIS — R519 Headache, unspecified: Secondary | ICD-10-CM

## 2022-03-15 NOTE — Progress Notes (Signed)
Subjective:    Patient ID: Courtney Hickman is a 62 y.o. female.  HPI    Star Age, MD, PhD [facility] [address] P.O. [place], [place] [address]  Dear Dr. Dennard Schaumann,  I saw your patient, Courtney Hickman, upon your kind request in my sleep clinic today for initial consultation of her sleep disorder, in particular, concern for underlying obstructive sleep apnea.  The patient is unaccompanied today.  As you know, Courtney Hickman is a 62 year old female with an underlying medical history of hypertension, anemia, arthritis, hearing impairment since childhood with bilateral hearing aids (and lip reading), reflux disease, hyperparathyroidism (s/p partial parathyroidectomy), and morbid obesity with a BMI of over 45, who reports snoring and excessive daytime somnolence.  She reports that her blood pressure tends to be elevated or suboptimal despite taking her four BP medications.  Of note, she did not actually take her medications this morning.  Her Epworth sleepiness score is 8 out of 24, fatigue severity score is 54 out of 63.  She has gained weight over time.  Her brother had sleep apnea and died at 82 from a heart attack.  She snores loudly enough that her husband cannot sleep in the same room with her so she ends up sleeping in a recliner in the living room and has done so for the past year.  She is working on weight loss, she is working with a nutritionist..  I reviewed your office note from 02/12/2022. Bedtime is generally around 1 AM.  She does not currently work.  She lives with her family including husband, daughter and son.  Her children are grown.  She does take her daughter to work at 8 AM.  She limits her caffeine to 1 cup of coffee in the morning, does not drink any alcohol, she is a non-smoker.  She has woken up with a headache, she denies night to night nocturia.  Her Past Medical History Is Significant For: Past Medical History:  Diagnosis Date   Anemia  08/23/2011   Dr. Benson Norway: EGD, Colonoscopy, Capsule Endo--all negative Had metrorhagia at that time   Arthritis    fingers   Hearing impairment    Heart burn    occ   HOH (hard of hearing)    Hyperparathyroidism ([place])    Hypertension    Wears hearing aid in both ears     Her Past Surgical History Is Significant For: Past Surgical History:  Procedure Laterality Date   CESAREAN SECTION     x2   COLONOSCOPY     COLONOSCOPY WITH PROPOFOL N/A 12/26/2021   Procedure: COLONOSCOPY WITH PROPOFOL;  Surgeon: Carol Ada, MD;  Location: [facility];  Service: Gastroenterology;  Laterality: N/A;   PARATHYROIDECTOMY Right 09/19/2018   Procedure: RIGHT PARATHYROIDECTOMY;  Surgeon: Armandina Gemma, MD;  Location: [facility];  Service: General;  Laterality: Right;   WISDOM TOOTH EXTRACTION      Her Family History Is Significant For: Family History  Problem Relation Age of Onset   Diabetes Mother    Hypertension Father    CVA Paternal Grandfather    Diabetes Brother    Hypertension Brother    COPD Brother    Breast cancer Neg Hx     Her Social History Is Significant For: Social History   Socioeconomic History   Marital status: Married    Spouse name: Not on file   Number of children: Not on file   Years of education: Not on file  Highest education level: Not on file  Occupational History   Not on file  Tobacco Use   Smoking status: Never   Smokeless tobacco: Never  Vaping Use   Vaping Use: Never used  Substance and Sexual Activity   Alcohol use: No   Drug use: No   Sexual activity: Not Currently    Birth control/protection: None  Other Topics Concern   Not on file  Social History Narrative   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   Social Determinants of Health   Financial Resource Strain: Not on file  Food Insecurity: Not on file  Transportation Needs: Not on file  Physical Activity: Not on file  Stress: Not on file   Social Connections: Not on file    Her Allergies Are:  Allergies  Allergen Reactions   Actifed Cold-Allergy [Chlorpheniramine-Phenyleph Er] Anaphylaxis    Eyes hurt / headache   Bactrim [Sulfamethoxazole-Trimethoprim] Rash  :   Her Current Medications Are:  Outpatient Encounter Medications as of 03/15/2022  Medication Sig   albuterol (VENTOLIN HFA) 108 (90 Base) MCG/ACT inhaler Inhale 1-2 puffs into the lungs every 6 (six) hours as needed for wheezing or shortness of breath.   amLODipine (NORVASC) 10 MG tablet TAKE 1 TABLET(10 MG) BY MOUTH DAILY   aspirin EC 81 MG tablet Take 81 mg by mouth daily.   Cholecalciferol 50 MCG (2000 UT) CAPS Take 1 capsule (2,000 Units total) by mouth daily with breakfast.   COLLAGEN PO Take 1 capsule by mouth daily.   CRANBERRY EXTRACT PO Take 1 capsule by mouth daily.   doxazosin (CARDURA) 4 MG tablet TAKE 1 TABLET(4 MG) BY MOUTH DAILY   KRILL OIL PO Take 1 capsule by mouth daily.   losartan (COZAAR) 100 MG tablet TAKE 1 TABLET(100 MG) BY MOUTH DAILY   metoprolol succinate (TOPROL-XL) 25 MG 24 hr tablet TAKE 1 TABLET(25 MG) BY MOUTH DAILY   No facility-administered encounter medications on file as of 03/15/2022.  :   Review of Systems:  Out of a complete 14 point review of systems, all are reviewed and negative with the exception of these symptoms as listed below:   Review of Systems  Neurological:        Severe fatigue, lack of energy, and hypersomnolence, Snoring. ESS 8  FSS 54.  Even though on 4 Bp meds still has hypertension.     Objective:  Neurological Exam  Physical Exam Physical Examination:   Vitals:   03/15/22 1121 03/15/22 1134  BP: (!) 152/78 (!) 151/79  Pulse: 72 77    General Examination: The patient is a very pleasant 62 y.o. female in no acute distress. She appears well-developed and well-nourished and well groomed.   HEENT: Normocephalic, atraumatic, pupils are equal, round and reactive to light, extraocular tracking  is good without limitation to gaze excursion or nystagmus noted. Hearing is grossly intact. Face is symmetric with normal facial animation. Speech is clear with no dysarthria noted. There is no hypophonia. There is no lip, neck/head, jaw or voice tremor. Neck is supple with full range of passive and active motion. There are no carotid bruits on auscultation. Oropharynx exam reveals: mild mouth dryness, adequate dental hygiene and moderate airway crowding, due to smaller airway entry, Mallampati class III, redundant soft palate and wider uvula noted, tip of uvula not fully visualized, tonsils about 1+ bilaterally.  Neck circumference 18-1/2 inches.  Mild overbite noted.  Tongue protrudes centrally and  palate elevates symmetrically.  Chest: Clear to auscultation without wheezing, rhonchi or crackles noted.  Heart: S1+S2+0, regular and normal without murmurs, rubs or gallops noted.   Abdomen: Soft, non-tender and non-distended.  Extremities: There is 1+ pitting edema in the distal lower extremities bilaterally.   Skin: Warm and dry without trophic changes noted.   Musculoskeletal: exam reveals no obvious joint deformities.   Neurologically:  Mental status: The patient is awake, alert and oriented in all 4 spheres. Her immediate and remote memory, attention, language skills and fund of knowledge are appropriate. There is no evidence of aphasia, agnosia, apraxia or anomia. Speech is clear with normal prosody and enunciation. Thought process is linear. Mood is normal and affect is normal.  Cranial nerves II - XII are as described above under HEENT exam.  Motor exam: Normal bulk, strength and tone is noted. There is no obvious action or resting tremor.  Fine motor skills and coordination: grossly intact.  Cerebellar testing: No dysmetria or intention tremor. There is no truncal or gait ataxia.  Sensory exam: intact to light touch in the upper and lower extremities.  Gait, station and balance: She  stands easily. No veering to one side is noted. No leaning to one side is noted. Posture is age-appropriate and stance is narrow based. Gait shows normal stride length and normal pace. No problems turning are noted.   Assessment and Plan:  In summary, Courtney Hickman is a very pleasant 62 y.o.-year old female with an underlying medical history of hypertension, anemia, arthritis, hearing impairment since childhood with bilateral hearing aids (and lip reading), reflux disease, hyperparathyroidism (s/p partial parathyroidectomy), and morbid obesity with a BMI of over 63, whose history and physical exam are concerning for sleep disordered breathing, particularly obstructive sleep apnea (OSA). A laboratory attended sleep study is typically considered "gold standard" for evaluation of sleep disordered breathing.   I had a long chat with the patient about my findings and the diagnosis of sleep apnea, particularly OSA, its prognosis and treatment options. We talked about medical/conservative treatments, surgical interventions and non-pharmacological approaches for symptom control. I explained, in particular, the risks and ramifications of untreated moderate to severe OSA, especially with respect to developing cardiovascular disease down the road, including congestive heart failure (CHF), difficult to treat hypertension, cardiac arrhythmias (particularly A-fib), neurovascular complications including TIA, stroke and dementia. Even type 2 diabetes has, in part, been linked to untreated OSA. Symptoms of untreated OSA may include (but may not be limited to) daytime sleepiness, nocturia (i.e. frequent nighttime urination), memory problems, mood irritability and suboptimally controlled or worsening mood disorder such as depression and/or anxiety, lack of energy, lack of motivation, physical discomfort, as well as recurrent headaches, especially morning or nocturnal headaches. We talked about the importance of maintaining a  healthy lifestyle and striving for healthy weight.  I recommended a sleep study at this time. I outlined the differences between a laboratory attended sleep study which is considered more comprehensive and accurate over the option of a home sleep test (HST); the latter may lead to underestimation of sleep disordered breathing in some instances and does not help with diagnosing upper airway resistance syndrome and is not accurate enough to diagnose primary central sleep apnea typically. I outlined possible surgical and non-surgical treatment options of OSA, including the use of a positive airway pressure (PAP) device (i.e. CPAP, AutoPAP/APAP or BiPAP in certain circumstances), a custom-made dental device (aka oral appliance, which would require a referral to a specialist dentist  or orthodontist typically, and is generally speaking not considered for patients with full dentures or edentulous state), upper airway surgical options, such as traditional UPPP (which is not considered a first-line treatment) or the Inspire device (hypoglossal nerve stimulator, which would involve a referral for consultation with an ENT surgeon, after careful selection, following inclusion criteria - also not first-line treatment). I explained the PAP treatment option to the patient in detail, as this is generally considered first-line treatment.  The patient indicated that she would be willing to try PAP therapy, if the need arises. I explained the importance of being compliant with PAP treatment, not only for insurance purposes but primarily to improve patient's symptoms symptoms, and for the patient's long term health benefit, including to reduce Her cardiovascular risks longer-term.    We will pick up our discussion about the next steps and treatment options after testing.  We will keep her posted as to the test results by phone call and/or MyChart messaging where possible.  We will plan to follow-up in sleep clinic accordingly as  well.  I answered all her questions today and the patient was in agreement.   I encouraged her to call with any interim questions, concerns, problems or updates or email [place] through [place].  Generally speaking, sleep test authorizations may take up to 2 weeks, sometimes less, sometimes longer, the patient is encouraged to get in touch with [place] if they do not hear back from the sleep lab staff directly within the next 2 weeks.  Thank you very much for allowing me to participate in the care of this nice patient. If I can be of any further assistance to you please do not hesitate to call me at [phone].  Sincerely,   Star Age, MD, PhD

## 2022-03-15 NOTE — Patient Instructions (Signed)

## 2022-03-26 ENCOUNTER — Encounter: Payer: Self-pay | Admitting: Nutrition

## 2022-04-05 ENCOUNTER — Telehealth: Payer: Self-pay | Admitting: Neurology

## 2022-04-05 NOTE — Telephone Encounter (Signed)
[employer] pending faxed notes 

## 2022-04-09 ENCOUNTER — Ambulatory Visit
Admission: EM | Admit: 2022-04-09 | Discharge: 2022-04-09 | Disposition: A | Discharge status: Home / Self Care | Attending: Family Medicine | Admitting: Family Medicine

## 2022-04-09 DIAGNOSIS — H66011 Acute suppurative otitis media with spontaneous rupture of ear drum, right ear: Secondary | ICD-10-CM | POA: Diagnosis not present

## 2022-04-09 MED ORDER — AMOXICILLIN 875 MG PO TABS: 875.0000 mg | ORAL_TABLET | Freq: Two times a day (BID) | ORAL | 0 refills | Status: DC

## 2022-04-09 MED ORDER — CIPROFLOXACIN-DEXAMETHASONE 0.3-0.1 % OT SUSP: 4.0000 [drp] | Freq: Two times a day (BID) | OTIC | 0 refills | Status: DC

## 2022-04-09 NOTE — ED Triage Notes (Signed)
Pt reports she had fluid coming from her right ear after removing her hearing aid. She also says it has a throbbing feeling down to her jaw x 1 day (not sure when symptoms began only when they got worse)

## 2022-04-09 NOTE — ED Provider Notes (Signed)
[facility]    CSN: [id number] Arrival date & time: 04/09/22  1538      History   Chief Complaint No chief complaint on file.   HPI Courtney Hickman is a 62 y.o. female.   Pt reports she had fluid coming from her right ear after removing her hearing aid. She also says it has a throbbing feeling down to her jaw x 1 day (not sure when symptoms began only when they got worse)      Past Medical History:  Diagnosis Date   Anemia 08/23/2011   Dr. 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   Vitamin D deficiency 11/23/2019   Hyperparathyroidism, primary ([place]) 04/22/2018   Hypercalcemia 04/08/2018   Obesity 04/27/2013   Hypertension    Hearing impairment     Past Surgical History:  Procedure Laterality Date   CESAREAN SECTION     x2   COLONOSCOPY     COLONOSCOPY WITH PROPOFOL N/A 12/26/2021   Procedure: COLONOSCOPY WITH PROPOFOL;  Surgeon: Carol Ada, MD;  Location: [facility];  Service: Gastroenterology;  Laterality: N/A;   PARATHYROIDECTOMY Right 09/19/2018   Procedure: RIGHT PARATHYROIDECTOMY;  Surgeon: Armandina Gemma, MD;  Location: [facility];  Service: General;  Laterality: Right;   WISDOM TOOTH EXTRACTION      OB History   No obstetric history on file.      Home Medications    Prior to Admission medications   Medication Sig Start Date End Date Taking? Authorizing Provider  amoxicillin (AMOXIL) 875 MG tablet Take 1 tablet (875 mg total) by mouth 2 (two) times daily. 04/09/22  Yes Volney American, PA-C  ciprofloxacin-dexamethasone [facility]) OTIC suspension Place 4 drops into the right ear 2 (two) times  daily. 04/09/22  Yes Volney American, PA-C  albuterol (VENTOLIN HFA) 108 (90 Base) MCG/ACT inhaler Inhale 1-2 puffs into the lungs every 6 (six) hours as needed for wheezing or shortness of breath. 11/18/20   Jaynee Eagles, PA-C  amLODipine (NORVASC) 10 MG tablet TAKE 1 TABLET(10 MG) BY MOUTH DAILY 02/05/22   Susy Frizzle, MD  aspirin EC 81 MG tablet Take 81 mg by mouth daily.    [provider]  Cholecalciferol 50 MCG (2000 UT) CAPS Take 1 capsule (2,000 Units total) by mouth daily with breakfast. 11/23/19   Nida, Marella Chimes, MD  COLLAGEN PO Take 1 capsule by mouth daily.    [provider]  CRANBERRY EXTRACT PO Take 1 capsule by mouth daily.    [provider]  doxazosin (CARDURA) 4 MG tablet TAKE 1 TABLET(4 MG) BY MOUTH DAILY 02/05/22   Susy Frizzle, MD  KRILL OIL PO Take 1 capsule by mouth daily.    [provider]  losartan (COZAAR) 100 MG tablet TAKE 1 TABLET(100 MG) BY MOUTH DAILY 02/05/22   Susy Frizzle, MD  metoprolol succinate (TOPROL-XL) 25 MG 24 hr tablet TAKE 1 TABLET(25 MG) BY MOUTH DAILY 02/05/22   Susy Frizzle, MD    Family History Family History  Problem Relation Age of  Onset   Diabetes Mother    Hypertension Father    CVA Paternal Grandfather    Diabetes Brother    Hypertension Brother    COPD Brother    Breast cancer Neg Hx     Social History Social History   Tobacco Use   Smoking status: Never   Smokeless tobacco: Never  Vaping Use   Vaping Use: Never used  Substance Use Topics   Alcohol use: No   Drug use: No     Allergies   Actifed cold-allergy [chlorpheniramine-phenyleph er] and Bactrim [sulfamethoxazole-trimethoprim]   Review of Systems Review of Systems PER HPI  Physical Exam Triage Vital Signs ED Triage Vitals  Enc Vitals Group     BP 04/09/22 1634 (!) 141/81     Pulse Rate 04/09/22 1634 86     Resp 04/09/22 1634 20     Temp 04/09/22 1634 97.7 F (36.5 C)     Temp Source  04/09/22 1634 Oral     SpO2 04/09/22 1634 92 %     Weight --      Height --      Head Circumference --      Peak Flow --      Pain Score 04/09/22 1641 3     Pain Loc --      Pain Edu? --      Excl. in [place]? --    No data found.  Updated Vital Signs BP (!) 141/81 (BP Location: Right Arm)   Pulse 86   Temp 97.7 F (36.5 C) (Oral)   Resp 20   LMP 10/16/2017 (Approximate)   SpO2 92%   Visual Acuity Right Eye Distance:   Left Eye Distance:   Bilateral Distance:    Right Eye Near:   Left Eye Near:    Bilateral Near:     Physical Exam Vitals and nursing note reviewed.  Constitutional:      Appearance: Normal appearance. She is not ill-appearing.  HENT:     Head: Atraumatic.     Left Ear: Tympanic membrane and external ear normal.     Ears:     Comments: Right TM erythematous, edematous with purulent fluid draining from small hole in the tympanic membrane    Nose: Nose normal.     Mouth/Throat:     Mouth: Mucous membranes are moist.     Pharynx: Oropharynx is clear.  Eyes:     Extraocular Movements: Extraocular movements intact.     Conjunctiva/sclera: Conjunctivae normal.  Cardiovascular:     Rate and Rhythm: Normal rate and regular rhythm.     Heart sounds: Normal heart sounds.  Pulmonary:     Effort: Pulmonary effort is normal.     Breath sounds: Normal breath sounds.  Musculoskeletal:        General: Normal range of motion.     Cervical back: Normal range of motion and neck supple.  Skin:    General: Skin is warm and dry.  Neurological:     Mental Status: She is alert and oriented to person, place, and time.  Psychiatric:        Mood and Affect: Mood normal.        Thought Content: Thought content normal.        Judgment: Judgment normal.      UC Treatments / Results  Labs (all labs ordered are listed, but only abnormal results are displayed) Labs Reviewed - No data to display  EKG   Radiology No results  found.  Procedures Procedures  (including critical care time)  Medications Ordered in UC Medications - No data to display  Initial Impression / Assessment and Plan / UC Course  I have reviewed the triage vital signs and the nursing notes.  Pertinent labs & imaging results that were available during my care of the patient were reviewed by me and considered in my medical decision making (see chart for details).     Treat with Amoxil, Ciprodex for right ear infection with small perforation.  Follow-up with PCP or ENT for recheck.  Final Clinical Impressions(s) / UC Diagnoses   Final diagnoses:  Acute suppurative otitis media of right ear with spontaneous rupture of tympanic membrane, recurrence not specified   Discharge Instructions   None    ED Prescriptions     Medication Sig Dispense Auth. Provider   ciprofloxacin-dexamethasone (CIPRODEX) OTIC suspension Place 4 drops into the right ear 2 (two) times daily. 7.5 mL Volney American, PA-C   amoxicillin (AMOXIL) 875 MG tablet Take 1 tablet (875 mg total) by mouth 2 (two) times daily. 20 tablet Volney American, [place]      PDMP not reviewed this encounter.   Volney American, [place] 04/09/22 1704

## 2022-05-02 ENCOUNTER — Ambulatory Visit: Admitting: Nutrition

## 2022-05-02 NOTE — Telephone Encounter (Signed)
[employer] auth: [id number] (exp. 02/09/22 to 08/08/22)   Patient is scheduled at [facility] for 05/06/22 at 8 pm.  Emailed packet with appointment information.

## 2022-05-06 ENCOUNTER — Ambulatory Visit (INDEPENDENT_AMBULATORY_CARE_PROVIDER_SITE_OTHER): Admitting: Neurology

## 2022-05-06 DIAGNOSIS — R519 Headache, unspecified: Secondary | ICD-10-CM

## 2022-05-06 DIAGNOSIS — Z820 Family history of epilepsy and other diseases of the nervous system: Secondary | ICD-10-CM

## 2022-05-06 DIAGNOSIS — G4733 Obstructive sleep apnea (adult) (pediatric): Secondary | ICD-10-CM

## 2022-05-06 DIAGNOSIS — G4720 Circadian rhythm sleep disorder, unspecified type: Secondary | ICD-10-CM

## 2022-05-06 DIAGNOSIS — R0683 Snoring: Secondary | ICD-10-CM

## 2022-05-06 DIAGNOSIS — R030 Elevated blood-pressure reading, without diagnosis of hypertension: Secondary | ICD-10-CM

## 2022-05-06 DIAGNOSIS — G4734 Idiopathic sleep related nonobstructive alveolar hypoventilation: Secondary | ICD-10-CM

## 2022-05-06 DIAGNOSIS — G4719 Other hypersomnia: Secondary | ICD-10-CM

## 2022-05-06 DIAGNOSIS — Z9189 Other specified personal risk factors, not elsewhere classified: Secondary | ICD-10-CM

## 2022-05-08 NOTE — Procedures (Signed)
Physician Interpretation:     [facility] POLYSOMNOGRAPHY  INTERPRETATION REPORT   STUDY DATE:  05/06/2022     PATIENT NAME:  Courtney Hickman         DATE OF BIRTH:  1960-08-14  PATIENT ID:  [id number]    TYPE OF STUDY:  PSG  READING PHYSICIAN: Huston Foley, MD, PhD   SCORING TECHNICIAN: Margaretann Loveless, RPSGT  Referred by: Donita Brooks, MD   History and Indication for Testing: 62 year old female with an underlying medical history of hypertension, anemia, arthritis, hearing impairment since childhood with bilateral hearing aids (and lip reading), reflux disease, hyperparathyroidism (s/p partial parathyroidectomy), and morbid obesity with a BMI of over 45, who reports snoring and excessive daytime somnolence. Her Epworth sleepiness score is 8 out of 24, fatigue severity score is 54 out of 63.  Height: 61 in Weight: 263 lb (BMI 49) Neck Size: 19 in   MEDICATIONS: Ventolin, Norvasc, Aspirin, Cholecalciferol, Collagen, Cranberry Extract, Cardura, Krill Oil, Cozaar, Toprol - XL  TECHNICAL DESCRIPTION: A registered sleep technologist was in attendance for the duration of the recording.  Data collection, scoring, video monitoring, and reporting were performed in compliance with the AASM Manual for the Scoring of Sleep and Associated Events; (Hypopnea is scored based on the criteria listed in Section VIII D. 1b in the AASM Manual V2.6 using a 4% oxygen desaturation rule or Hypopnea is scored based on the criteria listed in Section VIII D. 1a in the AASM Manual V2.6 using 3% oxygen desaturation and /or arousal rule).   SLEEP CONTINUITY AND SLEEP ARCHITECTURE:  The patient took over the counter cold medicine prior to start of the study. Lights-out was at 22:23: and lights-on at  05:04:, with a total recording time of 6 hours, 41.5 min.. The total sleep time ( TST) was 219.5 minutes with a decreased sleep efficiency at 54.7%. There was  6.6% REM sleep.   BODY POSITION:   TST was divided  between the following sleep positions: 87.5% supine;  12.5% lateral;  0% prone. Duration of total sleep and percent of total sleep in their respective position is as follows: supine 192 minutes (87%), non-supine 28 minutes (13%); right 00 minutes (0%), left 27 minutes (13%), and prone 00 minutes (0%).  Total supine REM sleep time was 14 minutes (100% of total REM sleep).  Sleep latency was markedly increased at 111.0 minutes.  REM sleep latency was increased at 167.5 minutes. Of the total sleep time, the percentage of stage N1 sleep was 7.3%, stage N2 sleep was 62%, which is increased, stage N3 sleep was 24.1%, which is mildly increased, and REM sleep was 6.6%, which is markedly reduced. Wake after sleep onset (WASO) time accounted for 71 minutes, with sleep fragmentation noted, ranging from minimal to moderate.   RESPIRATORY MONITORING:   Based on CMS criteria (using a 4% oxygen desaturation rule for scoring hypopneas), there were 8 apneas (8 obstructive; 0 central; 0 mixed), and 263 hypopneas.  Apnea index was 2.2. Hypopnea index was 71.9. The apnea-hypopnea index was 74.1/hour overall (69.4 supine, 0 non-supine; 86.9 REM, 86.9 supine REM).  There were 0 respiratory effort-related arousals (RERAs).  The RERA index was 0 events/h. Total respiratory disturbance index (RDI) was 74.1 events/h. RDI results showed: supine RDI  69.4 /h; non-supine RDI 106.9 /h; REM RDI 86.9 /h, supine REM RDI 86.9 /h.   Based on AASM criteria (using a 3% oxygen desaturation and /or arousal rule for scoring hypopneas), there were  8 apneas (8 obstructive; 0 central; 0 mixed), and 265 hypopneas. Apnea index was 2.2. Hypopnea index was 72.4. The apnea-hypopnea index was 74.6 overall (70.0 supine, 0 non-supine; 86.9 REM, 86.9 supine REM).  There were 0 respiratory effort-related arousals (RERAs).  The RERA index was 0 events/h. Total respiratory disturbance index (RDI) was 74.6 events/h. RDI results showed: supine  RDI  70.0 /h; non-supine RDI 106.9 /h; REM RDI 86.9 /h, supine REM RDI 86.9 /h.   OXIMETRY: Oxyhemoglobin Saturation Nadir during sleep was at  76% from a mean of 86%.  Of the Total sleep time (TST)   hypoxemia (=<88%) was present for  177.6 minutes, or 80.9% of total sleep time.   LIMB MOVEMENTS: There were 0 periodic limb movements of sleep (0.0/hr), of which 0 (0.0/hr) were associated with an arousal.  AROUSAL: There were 79 arousals in total, for an arousal index of 22 arousals/hour.  Of these, 75 were identified as respiratory-related arousals (21 /h), 0 were PLM-related arousals (0 /h), and 15 were non-specific arousals (4 /h).  EEG: Review of the EEG showed no abnormal electrical discharges and symmetrical bihemispheric findings.    EKG: The EKG revealed normal sinus rhythm (NSR). The average heart rate during sleep was 68 bpm.  AUDIO/VIDEO REVIEW: The audio and video review did not show any abnormal or unusual behaviors, movements, phonations or vocalizations. The patient took no restroom breaks. Snoring was in the mild to moderate range.  POST-STUDY QUESTIONNAIRE: Post study, the patient indicated, that sleep was worse than usual.   IMPRESSION:  1. Severe Obstructive Sleep Apnea (OSA) 2. Nocturnal hypoxemia 3. Dysfunctions associated with sleep stages or arousal from sleep  RECOMMENDATIONS:  1. This study demonstrates severe obstructive sleep apnea, with a total AHI of 74.1/hour, REM AHI of 86.9/hour, supine AHI of 69.4/hour and O2 nadir of 76%, associated with nocturnal hypoxemia. The patient did not qualify for an emergency split study protocol due to delay in sleep onset and lower sleep efficiency. Urgent treatment with a positive airway pressure (PAP) device is highly recommended.  The patient will be advised to proceed with home autoPAP therapy for now.  A laboratory attended, full night PAP-titration study can be considered down the road, to optimize therapy settings, mask fit,  monitoring of tolerance and of proper oxygen saturations. Other treatment options may be limited, and may include (generally speaking) surgical options in selected patients. Concomitant weight loss is also highly recommended. Please note that untreated obstructive sleep apnea may carry additional perioperative morbidity. Patients with significant obstructive sleep apnea should receive perioperative PAP therapy and the surgeons and particularly the anesthesiologist should be informed of the diagnosis and the severity of the sleep disordered breathing. 2. This study shows sleep fragmentation and abnormal sleep stage percentages; these are nonspecific findings and per se do not signify an intrinsic sleep disorder or a cause for the patient's sleep-related symptoms. Causes include (but are not limited to) the first night effect of the sleep study, circadian rhythm disturbances, medication effect or an underlying mood disorder or medical problem.  3. The patient should be cautioned not to drive, work at heights, or operate dangerous or heavy equipment when tired or sleepy. Review and reiteration of good sleep hygiene measures should be pursued with any patient. 4. The patient will be seen in follow-up in the sleep clinic at [facility] for discussion of the test results, symptom and treatment compliance review, further management strategies, etc. The patient and the referring provider will be notified of  the test results.   I certify that I have reviewed the entire raw data recording prior to the issuance of this report in accordance with the Standards of Accreditation of the American Academy of Sleep Medicine (AASM).  Huston Foley, MD, PhD Medical Director, [facility]) Diplomat, ABPN (Neurology and Sleep)               Technical Report:   General Information  Name: Rebbecca, Osuna BMI: 16.10 Physician: Huston Foley, MD  ID: [id number] Height: 61.0 in Technician:  Margaretann Loveless, RPSGT  Sex: Female Weight: 263.0 lb Record: [id number]  Age: 2 [23-Oct-1960] Date: 05/06/2022    Medical & Medication History    62 year old female with an underlying medical history of hypertension, anemia, arthritis, hearing impairment since childhood with bilateral hearing aids (and lip reading), reflux disease, hyperparathyroidism (s/p partial parathyroidectomy), and morbid obesity with a BMI of over 45, who reports snoring and excessive daytime somnolence. She reports that her blood pressure tends to be elevated or suboptimal despite taking her four BP medications. Of note, she did not actually take her medications this morning. Her Epworth sleepiness score is 8 out of 24, fatigue severity score is 54 out of 63. She has gained weight over time. Her brother had sleep apnea and died at 55 from a heart attack. She snores loudly enough that her husband cannot sleep in the same room with her so she ends up sleeping in a recliner in the living room and has done so for the past year. S Ventolin, Norvasc, Aspirin, Cholecalciferol, Collagen, Cranberry Extract, Cardura, Krill Oil, Cozaar, Toprol - XL   Sleep Disorder      Comments   The patient came into the lab for a PSG. The patient did take cold medicine prior to start of study. Per patient she is getting over a cold. The patient had no restroom breaks. EKG kept in NSR. Mild to Moderate snoring. All sleep stages witnessed. Respiratory events scored with a 4% desat. Slept lateral and supine. AHI was 78.8 after 2hrs of TST. The patient did not reach 2 hrs of TST until 2:34 am. Limited REM sleep.    Lights out: 10:23:00 PM Lights on: 05:04:24 AM   Time Total Supine Side Prone Upright  Recording (TRT) 6h 41.55m 4h 50.23m 1h 51.6m 0h 0.21m 0h 0.25m  Sleep (TST) 3h 39.74m 3h 12.69m 0h 27.13m 0h 0.11m 0h 0.8m   Latency N1 N2 N3 REM Onset Per. Slp. Eff.  Actual 0h 0.49m 0h 2.48m 0h 47.35m 2h 47.32m 1h 51.36m 1h 52.72m 54.67%   Stg Dur Wake N1 N2 N3 REM   Total 182.0 16.0 136.0 53.0 14.5  Supine 98.5 7.5 117.0 53.0 14.5  Side 83.5 8.5 19.0 0.0 0.0  Prone 0.0 0.0 0.0 0.0 0.0  Upright 0.0 0.0 0.0 0.0 0.0   Stg % Wake N1 N2 N3 REM  Total 45.3 7.3 62.0 24.1 6.6  Supine 24.5 3.4 53.3 24.1 6.6  Side 20.8 3.9 8.7 0.0 0.0  Prone 0.0 0.0 0.0 0.0 0.0  Upright 0.0 0.0 0.0 0.0 0.0     Apnea Summary Sub Supine Side Prone Upright  Total 8 Total 0 0    REM 0 0 0 0 0    NREM 0 0  Obs 8 REM 0 0 0 0 0    NREM 0 0  Mix 0 REM 0 0 0 0 0  NREM 0 0 0 0 0  Cen 0 REM 0 0 0 0 0    NREM 0 0 0 0 0   Rera Summary Sub Supine Side Prone Upright  Total 0 Total 0 0 0 0 0    REM 0 0 0 0 0    NREM 0 0 0 0 0   Hypopnea Summary Sub Supine Side Prone Upright  Total 265 Total 265 218 47 0 0    REM 21 21 0 0 0    NREM 244 197 47 0 0   4% Hypopnea Summary Sub Supine Side Prone Upright  Total (4%) 263 Total 263 216 47 0 0    REM 21 21 0 0 0    NREM 242 195 47 0 0     AHI Total Obs Mix Cen  74.62 Apnea 2.19 2.19 0.00 0.00   Hypopnea 72.44 -- -- --  74.08 Hypopnea (4%) 71.89 -- -- --    Total Supine Side Prone Upright  Position AHI 74.62 70.00 106.91 0.00 0.00  REM AHI 86.90   NREM AHI 73.76   Position RDI 74.62 70.00 106.91 0.00 0.00  REM RDI 86.90   NREM RDI 73.76    4% Hypopnea Total Supine Side Prone Upright  Position AHI (4%) 74.08 69.38 106.91 0.00 0.00  REM AHI (4%) 86.90   NREM AHI (4%) 73.17   Position RDI (4%) 74.08 69.38 106.91 0.00 0.00  REM RDI (4%) 86.90   NREM RDI (4%) 73.17    Desaturation Information Threshold: 2% <100% <90% <80% <70% <60% <50% <40%  Supine 315.0 293.0 20.0 0.0 0.0 0.0 0.0  Side 152.0 147.0 2.0 0.0 0.0 0.0 0.0  Prone 0.0 0.0 0.0 0.0 0.0 0.0 0.0  Upright 0.0 0.0 0.0 0.0 0.0 0.0 0.0  Total 467.0 440.0 22.0 0.0 0.0 0.0 0.0  Index 111.2 104.8 5.2 0.0 0.0 0.0 0.0   Threshold: 3% <100% <90% <80% <70% <60% <50% <40%  Supine 277.0 265.0 20.0 0.0 0.0 0.0 0.0  Side 147.0 142.0 2.0 0.0 0.0 0.0  0.0  Prone 0.0 0.0 0.0 0.0 0.0 0.0 0.0  Upright 0.0 0.0 0.0 0.0 0.0 0.0 0.0  Total 424.0 407.0 22.0 0.0 0.0 0.0 0.0  Index 101.0 96.9 5.2 0.0 0.0 0.0 0.0   Threshold: 4% <100% <90% <80% <70% <60% <50% <40%  Supine 258.0 256.0 20.0 0.0 0.0 0.0 0.0  Side 136.0 134.0 2.0 0.0 0.0 0.0 0.0  Prone 0.0 0.0 0.0 0.0 0.0 0.0 0.0  Upright 0.0 0.0 0.0 0.0 0.0 0.0 0.0  Total 394.0 390.0 22.0 0.0 0.0 0.0 0.0  Index 93.8 92.9 5.2 0.0 0.0 0.0 0.0   Threshold: 3% <100% <90% <80% <70% <60% <50% <40%  Supine 277 265 20 0 0 0 0  Side 147 142 2 0 0 0 0  Prone 0 0 0 0 0 0 0  Upright 0 0 0 0 0 0 0  Total 424 407 22 0 0 0 0   Awakening/Arousal Information # of Awakenings 18  Wake after sleep onset 71.43m  Wake after persistent sleep 70.68m   Arousal Assoc. Arousals Index  Apneas 1 0.3  Hypopneas 74 20.2  Leg Movements 0 0.0  Snore 0 0.0  PTT Arousals 0 0.0  Spontaneous 15 4.1  Total 89 24.3  Leg Movement Information PLMS LMs Index  Total LMs during PLMS 0 0.0  LMs w/ Microarousals 0 0.0   LM LMs Index  w/ Microarousal 0 0.0  w/ Awakening 0 0.0  w/ Resp Event 0 0.0  Spontaneous 0 0.0  Total 0 0.0     Desaturation threshold setting: 3% Minimum desaturation setting: 10 seconds SaO2 nadir: 76% The longest event was a 53 sec obstructive Hypopnea with a minimum SaO2 of 83%. The lowest SaO2 was 76% associated with a 52 sec obstructive Hypopnea. EKG Rates EKG Avg Max Min  Awake 76 97 60  Asleep 68 84 59  EKG Events: Tachycardia

## 2022-05-08 NOTE — Addendum Note (Signed)
Addended by: Huston Foley on: 05/08/2022 05:57 PM   Modules accepted: Orders

## 2022-05-09 ENCOUNTER — Telehealth: Payer: Self-pay | Admitting: *Deleted

## 2022-05-09 NOTE — Telephone Encounter (Signed)
-----   Message from Huston Foley, MD sent at 05/08/2022  5:57 PM EDT ----- Urgent set up requested on PAP therapy, due to severe OSA. Patient referred by PCP, seen by me on 03/15/22, diagnostic PSG on 05/06/22.    Please call and notify the patient that the recent sleep study showed severe obstructive sleep apnea. I recommend urgent treatment for in the form of autoPAP. We may consider at a CPAP titration study at a later date, if need be, which means, that we would ask her to come back in for a second sleep study with CPAP treatment. For now, I would like to start her on a so-called autoPAP machine at home, through a DME company (of her choice, or as per insurance requirement). The DME representative will educate her on how to use the machine, how to put the mask on, etc. I have placed an order in the chart. Please send referral, talk to patient, send report to referring MD. We will need a FU in sleep clinic for 10 weeks post-PAP set up, please arrange that with me or one of our NPs. Thanks,   Huston Foley, MD, PhD [facility])

## 2022-05-09 NOTE — Telephone Encounter (Signed)
Spoke with patient and discussed the sleep study results as noted below by Dr Frances Furbish. The patient is amenable to proceeding with auto-pap. We discussed the insurance compliance requirements which includes using the machine at least 4 hours at night and also being seen in the office here between 30 and 90 days after setup. Pt was scheduled for an initial follow-up on 07/30/22 at 10:45 AM arrival 10:15 AM. Pt did not have a preference for DME company. She lives in [place]. Will see if [facility] takes [employer]. Otherwise we can refer to [facility]. We will get back to the patient with DME company. Pt verbalized appreciation.   Called [employer] and found out they are not in network with [employer]. Referral sent to [facility] and I notified the patient.

## 2022-05-10 ENCOUNTER — Encounter: Payer: Self-pay | Admitting: Family Medicine

## 2022-05-10 NOTE — Telephone Encounter (Signed)
[facility] confirmed receipt of order.  

## 2022-05-23 HISTORY — PX: REFRACTIVE SURGERY: SHX103

## 2022-07-30 ENCOUNTER — Encounter: Payer: Self-pay | Admitting: Neurology

## 2022-07-30 ENCOUNTER — Ambulatory Visit: Admitting: Neurology

## 2022-07-30 VITALS — BP 160/89 | HR 78 | Ht 61.0 in | Wt 267.6 lb

## 2022-07-30 DIAGNOSIS — G4733 Obstructive sleep apnea (adult) (pediatric): Secondary | ICD-10-CM | POA: Diagnosis not present

## 2022-07-30 DIAGNOSIS — G4734 Idiopathic sleep related nonobstructive alveolar hypoventilation: Secondary | ICD-10-CM

## 2022-07-30 NOTE — Progress Notes (Addendum)
Order for ONO x 1 while on autopap at home has been sent to [facility]. Order receipt confirmed by Elease Hashimoto w/ [employer].

## 2022-07-30 NOTE — Progress Notes (Signed)
Subjective:    Patient ID: Courtney Hickman is a 62 y.o. female.  HPI    Interim history:   Courtney Hickman is a 62 year old female with an underlying medical history of hypertension, anemia, arthritis, hearing impairment since childhood with bilateral hearing aids (and lip reading), reflux disease, hyperparathyroidism (s/p partial parathyroidectomy), and morbid obesity with a BMI of over 45, who presents for follow-up consultation of her obstructive sleep apnea, after interim testing and starting home AutoPap therapy.  The patient is unaccompanied today.  I first met her at the request of her primary care physician on 03/15/2022, at which time Courtney Hickman reported snoring and excessive daytime somnolence.  Courtney Hickman was advised to proceed with a sleep study.  Courtney Hickman had a baseline sleep study through our sleep lab on 05/06/2022.  Sleep efficiency was decreased at 54.7%, sleep latency was delayed at 111 minutes, REM latency was 167.5 minutes.  Courtney Hickman had an increased percentage of stage II sleep and a an mildly increased percentage of slow-wave sleep, as well as decreased percentage of REM sleep at 6.6%.  Her total AHI was in the severe range at 74.1/h, REM AHI 86.9/h, supine AHI 69.4/h, O2 nadir 76%.  Time below or at 88% saturation was 177.6 minutes, indicating nocturnal hypoxemia.  Courtney Hickman was advised to proceed with home AutoPap therapy.  Her set up date was 05/24/2022.  Courtney Hickman has a ResMed air sense 11 AutoSet machine.  Her DME provider is [facility].    Today, 07/30/2022: I reviewed her AutoPap compliance data from 06/25/2022 through 07/24/2022, which is a total of 30 days, during which time Courtney Hickman used her machine every night with percent use days greater than 4 hours at 100%, indicating superb compliance with an average usage of 7 hours and 4 minutes, residual AHI at goal at 0.4/h, average pressure for the 95th percentile at 9.8 cm with a range of 6 to 14 cm with EPR of 3.  Leak on the lower side with the 95th percentile at 4.4 L/min.  Courtney Hickman  reports feeling better, Courtney Hickman is quite pleased with Courtney Hickman has adjusted to her AutoPap machine, Courtney Hickman uses a fullface mask with fairly good tolerance.  Courtney Hickman indicates having more energy during the day and feeling less sleepy during the day and Courtney Hickman is certainly motivated to continue with treatment.  Courtney Hickman has an Epworth sleepiness score of 3 out of 24 today. Courtney Hickman has had some mouth dryness but it is not severe, gets better after Courtney Hickman wakes up.  Courtney Hickman tries to hydrate well with water.   The patient's allergies, current medications, family history, past medical history, past social history, past surgical history and problem list were reviewed and updated as appropriate.   Previously:   03/15/22: (Courtney Hickman) reports snoring and excessive daytime somnolence.  Courtney Hickman reports that her blood pressure tends to be elevated or suboptimal despite taking her four BP medications.  Of note, Courtney Hickman did not actually take her medications this morning.  Her Epworth sleepiness score is 8 out of 24, fatigue severity score is 54 out of 63.  Courtney Hickman has gained weight over time.  Her brother had sleep apnea and died at 71 from a heart attack.  Courtney Hickman snores loudly enough that her husband cannot sleep in the same room with her so Courtney Hickman ends up sleeping in a recliner in the living room and has done so for the past year.  Courtney Hickman is working on weight loss, Courtney Hickman is working with a nutritionist..  I reviewed your office note  from 02/12/2022. Bedtime is generally around 1 AM.  Courtney Hickman does not currently work.  Courtney Hickman lives with her family including husband, daughter and son.  Her children are grown.  Courtney Hickman does take her daughter to work at 8 AM.  Courtney Hickman limits her caffeine to 1 cup of coffee in the morning, does not drink any alcohol, Courtney Hickman is a non-smoker.  Courtney Hickman has woken up with a headache, Courtney Hickman denies night to night nocturia.   Her Past Medical History Is Significant For: Past Medical History:  Diagnosis Date   Anemia 08/23/2011   Dr. Elnoria Howard: EGD, Colonoscopy, Capsule Endo--all negative  Had metrorhagia at that time   Arthritis    fingers   Hearing impairment    Heart burn    occ   HOH (hard of hearing)    Hyperparathyroidism (HCC)    Hypertension    Sleep apnea    Wears hearing aid in both ears     Her Past Surgical History Is Significant For: Past Surgical History:  Procedure Laterality Date   CESAREAN SECTION     x2   COLONOSCOPY     COLONOSCOPY WITH PROPOFOL N/A 12/26/2021   Procedure: COLONOSCOPY WITH PROPOFOL;  Surgeon: Jeani Hawking, MD;  Location: [facility];  Service: Gastroenterology;  Laterality: N/A;   PARATHYROIDECTOMY Right 09/19/2018   Procedure: RIGHT PARATHYROIDECTOMY;  Surgeon: Darnell Level, MD;  Location: [facility];  Service: General;  Laterality: Right;   REFRACTIVE SURGERY  05/2022   WISDOM TOOTH EXTRACTION      Her Family History Is Significant For: Family History  Problem Relation Age of Onset   Diabetes Mother    Hypertension Father    CVA Paternal Grandfather    Diabetes Brother    Hypertension Brother    COPD Brother    Breast cancer Neg Hx     Her Social History Is Significant For: Social History   Socioeconomic History   Marital status: Married    Spouse name: Not on file   Number of children: Not on file   Years of education: Not on file   Highest education level: Not on file  Occupational History   Not on file  Tobacco Use   Smoking status: Never   Smokeless tobacco: Never  Vaping Use   Vaping Use: Never used  Substance and Sexual Activity   Alcohol use: No   Drug use: No   Sexual activity: Not Currently    Birth control/protection: None  Other Topics Concern   Not on file  Social History Narrative   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   Social Determinants of Health   Financial Resource Strain: Not on file  Food Insecurity: Not on file  Transportation Needs: Not on file  Physical Activity: Not on file  Stress: Not on file  Social Connections: Not  on file    Her Allergies Are:  Allergies  Allergen Reactions   Actifed Cold-Allergy [Chlorpheniramine-Phenyleph Er] Anaphylaxis    Eyes hurt / headache   Bactrim [Sulfamethoxazole-Trimethoprim] Rash  :   Her Current Medications Are:  Outpatient Encounter Medications as of 07/30/2022  Medication Sig   albuterol (VENTOLIN HFA) 108 (90 Base) MCG/ACT inhaler Inhale 1-2 puffs into the lungs every 6 (six) hours as needed for wheezing or shortness of breath.   amLODipine (NORVASC) 10 MG tablet TAKE 1 TABLET(10 MG) BY MOUTH DAILY   aspirin EC 81 MG tablet Take 81 mg by mouth daily.  Cholecalciferol 50 MCG (2000 UT) CAPS Take 1 capsule (2,000 Units total) by mouth daily with breakfast.   COLLAGEN PO Take 1 capsule by mouth daily.   CRANBERRY EXTRACT PO Take 1 capsule by mouth daily.   doxazosin (CARDURA) 4 MG tablet TAKE 1 TABLET(4 MG) BY MOUTH DAILY   KRILL OIL PO Take 1 capsule by mouth daily.   losartan (COZAAR) 100 MG tablet TAKE 1 TABLET(100 MG) BY MOUTH DAILY   metoprolol succinate (TOPROL-XL) 25 MG 24 hr tablet TAKE 1 TABLET(25 MG) BY MOUTH DAILY   amoxicillin (AMOXIL) 875 MG tablet Take 1 tablet (875 mg total) by mouth 2 (two) times daily.   ciprofloxacin-dexamethasone (CIPRODEX) OTIC suspension Place 4 drops into the right ear 2 (two) times daily.   No facility-administered encounter medications on file as of 07/30/2022.  :  Review of Systems:  Out of a complete 14 point review of systems, all are reviewed and negative with the exception of these symptoms as listed below:  Review of Systems  Neurological:        Pt is here Alone. Pt states that things have been going good with her CPAP Machine. Pt states that Courtney Hickman has some dry mouth with her CPAP Machine. Pt states that her Fatigue throughout the day has improved. Pt states that Courtney Hickman has occasional headaches. Pt states no trouble breathing with her CPAP Machine. Pt states no new symptoms to report today.     Objective:  Neurological  Exam  Physical Exam Physical Examination:   Vitals:   07/30/22 1101  BP: (!) 160/89  Pulse: 78    General Examination: The patient is a very pleasant 62 y.o. female in no acute distress. Courtney Hickman appears well-developed and well-nourished and well groomed.   HEENT: Normocephalic, atraumatic, pupils are equal, round and reactive to light, corrective eye glasses in place. Extraocular tracking is good without limitation to gaze excursion or nystagmus noted. Hearing is grossly intact with bilateral hearing aids and lip reading, mild speech impediment. Face is symmetric with normal facial animation. Speech without dysarthria. Neck is supple with full range of passive and active motion. There are no carotid bruits on auscultation. Oropharynx exam reveals: mild mouth dryness, adequate dental hygiene and moderate airway crowding.  Tongue protrudes centrally and palate elevates symmetrically.   Chest: Clear to auscultation without wheezing, rhonchi or crackles noted.   Heart: S1+S2+0, regular and normal without murmurs, rubs or gallops noted.    Abdomen: Soft, non-tender and non-distended.   Extremities: There is 1+ pitting edema in the distal lower extremities bilaterally.    Skin: Warm and dry without trophic changes noted.    Musculoskeletal: exam reveals no obvious joint deformities.    Neurologically:  Mental status: The patient is awake, alert and oriented in all 4 spheres. Her immediate and remote memory, attention, language skills and fund of knowledge are appropriate. There is no evidence of aphasia, agnosia, apraxia or anomia. Speech as above.  Thought process is linear. Mood is normal and affect is normal.  Cranial nerves II - XII are as described above under HEENT exam.  Motor exam: Normal bulk, strength and tone is noted. There is no obvious action or resting tremor.  Fine motor skills and coordination: grossly intact.  Cerebellar testing: No dysmetria or intention tremor. There is no  truncal or gait ataxia.  Sensory exam: intact to light touch in the upper and lower extremities.  Gait, station and balance: Courtney Hickman stands easily. No veering to one side is  noted. No leaning to one side is noted. Posture is age-appropriate and stance is narrow based. Gait shows normal stride length and normal pace. No problems turning are noted.    Assessment and Plan:  In summary, BARNETTA BEVIER is a very pleasant 62 year old female with an underlying medical history of hypertension, anemia, arthritis, hearing impairment since childhood with bilateral hearing aids (and lip reading), reflux disease, hyperparathyroidism (s/p partial parathyroidectomy), and morbid obesity with a BMI of over 45, who presents for follow-up consultation of her obstructive sleep apnea, after interim testing and starting home AutoPap therapy.  Courtney Hickman had a baseline sleep study through our sleep lab on 05/06/2022, which showed severe OSA with an AHI of 74.1/h, REM AHI 86.9/h, supine AHI 69.4/h, O2 nadir 76%.  Time below or at 88% saturation was 177.6 minutes, indicating nocturnal hypoxemia.  Courtney Hickman did not have a split night study, due to decrease in sleep efficiency, delay in sleep onset.  Courtney Hickman has been on AutoPap therapy since 05/24/2022.  Courtney Hickman is fully compliant with treatment and has benefited from it.  Courtney Hickman is commended for treatment adherence.  Courtney Hickman is motivated to continue with her AutoPap, settings are good with good apnea control and low leak from the mask.  Courtney Hickman tolerates the pressure and the interface quite well.  I would like to proceed with a home pulse oximetry test overnight for 1 night while Courtney Hickman is using her AutoPap to make sure her oxygen saturations are adequate while on treatment as Courtney Hickman did have significant drops into the lower 80s repeatedly and some into the mid 70s during the sleep study.  Courtney Hickman is agreeable to the ONO, I explained this test to her.  We will call her with her test results.  If all goes well Courtney Hickman can follow-up  routinely in this clinic in 1 year.  I answered all her questions today and Courtney Hickman was in agreement.  We talked about her sleep study results in detail today and reviewed her compliance data as well.  I spent 40 minutes in total face-to-face time and in reviewing records during pre-charting, more than 50% of which was spent in counseling and coordination of care, reviewing test results, reviewing medications and treatment regimen and/or in discussing or reviewing the diagnosis of OSA, nocturnal hypoxemia, the prognosis and treatment options. Pertinent laboratory and imaging test results that were available during this visit with the patient were reviewed by me and considered in my medical decision making (see chart for details).

## 2022-07-30 NOTE — Patient Instructions (Signed)
It was nice to see you again today. I am glad to hear, things are going well with your autoPAP therapy. You have adjusted well to treatment with your new machine, and you are compliant with it. You have also fulfilled the insurance-mandated compliance percentage, which is reassuring, so you can get ongoing supplies through your insurance. Please talk to your DME provider about getting replacement supplies on a regular basis. Please be sure to change your filter every month, your mask about every 3 months, hose about every 6 months, humidifier chamber about yearly. Some restrictions are imposed by your insurance carrier with regard to how frequently you can get certain supplies.  Your DME company can provide further details if necessary.   Please continue using your autoPAP regularly. While your insurance requires that you use PAP at least 4 hours each night on 70% of the nights, I recommend, that you not skip any nights and use it throughout the night if you can. Getting used to PAP and staying with the treatment long term does take time and patience and discipline. Untreated obstructive sleep apnea when it is moderate to severe can have an adverse impact on cardiovascular health and raise her risk for heart disease, arrhythmias, hypertension, congestive heart failure, stroke and diabetes. Untreated obstructive sleep apnea causes sleep disruption, nonrestorative sleep, and sleep deprivation. This can have an impact on your day to day functioning and cause daytime sleepiness and impairment of cognitive function, memory loss, mood disturbance, and problems focussing. Using PAP regularly can improve these symptoms.  As discussed, we will do an overnight oxygen level test, called ONO, and your DME company will call and set this up for one night, while you also use your autoPAP as usual. We will call you with the results. This is to make sure that your oxygen levels stay in the 90s, while you are treated with autoPAP  for your OSA. Remember, your oxygen levels dropped into the 70s repeatedly during the sleep study.     We can see you in 1 year, you can see one of our nurse practitioners as you are stable.

## 2022-08-15 ENCOUNTER — Encounter: Payer: Self-pay | Admitting: Family Medicine

## 2022-08-15 ENCOUNTER — Ambulatory Visit: Admitting: Family Medicine

## 2022-08-15 VITALS — BP 130/70 | HR 87 | Temp 97.9°F | Ht 61.0 in | Wt 270.0 lb

## 2022-08-15 DIAGNOSIS — H65192 Other acute nonsuppurative otitis media, left ear: Secondary | ICD-10-CM | POA: Diagnosis not present

## 2022-08-15 DIAGNOSIS — H6690 Otitis media, unspecified, unspecified ear: Secondary | ICD-10-CM | POA: Insufficient documentation

## 2022-08-15 MED ORDER — AMOXICILLIN 500 MG PO CAPS: 500.0000 mg | ORAL_CAPSULE | Freq: Two times a day (BID) | ORAL | 0 refills | Status: AC

## 2022-08-15 NOTE — Assessment & Plan Note (Signed)
Patient presents with bilateral ear pain and left TM erythema and bulging. Will treat with Amoxicillin 500mg  BID for 7 days. Return to office if symptoms worsen or persist.

## 2022-08-15 NOTE — Progress Notes (Signed)
Subjective:  HPI: Courtney Hickman is a 62 y.o. female presenting on 08/15/2022 for Follow-up (poss Left ear infection; pain since last week - [facility])   HPI Patient is in today for left ear pain since last week associated with headache. She does endorse some mild muffled hearing. The pain is mild in her right ear as well. Denies fever, body aches, chills, ear drainage Has tried Ibuprofen  Review of Systems  All other systems reviewed and are negative.   Relevant past medical history reviewed and updated as indicated.   Past Medical History:  Diagnosis Date   Anemia 08/23/2011   Dr. Elnoria Ahlia Lemanski: EGD, Colonoscopy, Capsule Endo--all negative Had metrorhagia at that time   Arthritis    fingers   Hearing impairment    Heart burn    occ   HOH (hard of hearing)    Hyperparathyroidism (HCC)    Hypertension    Sleep apnea    Wears hearing aid in both ears      Past Surgical History:  Procedure Laterality Date   CESAREAN SECTION     x2   COLONOSCOPY     COLONOSCOPY WITH PROPOFOL N/A 12/26/2021   Procedure: COLONOSCOPY WITH PROPOFOL;  Surgeon: Jeani Hawking, MD;  Location: [facility];  Service: Gastroenterology;  Laterality: N/A;   PARATHYROIDECTOMY Right 09/19/2018   Procedure: RIGHT PARATHYROIDECTOMY;  Surgeon: Darnell Level, MD;  Location: [facility];  Service: General;  Laterality: Right;   REFRACTIVE SURGERY  05/2022   WISDOM TOOTH EXTRACTION      Allergies and medications reviewed and updated.   Current Outpatient Medications:    albuterol (VENTOLIN HFA) 108 (90 Base) MCG/ACT inhaler, Inhale 1-2 puffs into the lungs every 6 (six) hours as needed for wheezing or shortness of breath., Disp: 18 g, Rfl: 0   amLODipine (NORVASC) 10 MG tablet, TAKE 1 TABLET(10 MG) BY MOUTH DAILY, Disp: 90 tablet, Rfl: 3   amoxicillin (AMOXIL) 500 MG capsule, Take 1 capsule (500 mg total) by mouth 2 (two) times daily for 7 days., Disp: 14 capsule, Rfl: 0   aspirin EC 81 MG tablet, Take 81 mg by mouth  daily., Disp: , Rfl:    Cholecalciferol 50 MCG (2000 UT) CAPS, Take 1 capsule (2,000 Units total) by mouth daily with breakfast., Disp: 90 capsule, Rfl: 3   COLLAGEN PO, Take 1 capsule by mouth daily., Disp: , Rfl:    CRANBERRY EXTRACT PO, Take 1 capsule by mouth daily., Disp: , Rfl:    doxazosin (CARDURA) 4 MG tablet, TAKE 1 TABLET(4 MG) BY MOUTH DAILY, Disp: 90 tablet, Rfl: 3   KRILL OIL PO, Take 1 capsule by mouth daily., Disp: , Rfl:    losartan (COZAAR) 100 MG tablet, TAKE 1 TABLET(100 MG) BY MOUTH DAILY, Disp: 90 tablet, Rfl: 3   metoprolol succinate (TOPROL-XL) 25 MG 24 hr tablet, TAKE 1 TABLET(25 MG) BY MOUTH DAILY, Disp: 90 tablet, Rfl: 3  Allergies  Allergen Reactions   Actifed Cold-Allergy [Chlorpheniramine-Phenyleph Er] Anaphylaxis    Eyes hurt / headache   Bactrim [Sulfamethoxazole-Trimethoprim] Rash    Objective:   BP 130/70   Pulse 87   Temp 97.9 F (36.6 C) (Oral)   Ht 5\' 1"  (1.549 m)   Wt 270 lb (122.5 kg)   LMP 10/16/2017 (Approximate)   SpO2 97%   BMI 51.02 kg/m      08/15/2022    3:10 PM 07/30/2022   11:01 AM 04/09/2022    4:34 PM  Vitals with BMI  Height 5\' 1"  5\' 1"    Weight 270 lbs 267 lbs 10 oz   BMI 51.04 50.59   Systolic 130 160 829  Diastolic 70 89 81  Pulse 87 78 86     Physical Exam Vitals and nursing note reviewed.  Constitutional:      Appearance: Normal appearance. She is normal weight.  HENT:     Head: Normocephalic and atraumatic.     Right Ear: Tympanic membrane, ear canal and external ear normal. Decreased hearing noted.     Left Ear: Decreased hearing noted. Tympanic membrane is erythematous and bulging.  Skin:    General: Skin is warm and dry.  Neurological:     General: No focal deficit present.     Mental Status: She is alert and oriented to person, place, and time. Mental status is at baseline.  Psychiatric:        Mood and Affect: Mood normal.        Behavior: Behavior normal.        Thought Content: Thought content  normal.        Judgment: Judgment normal.     Assessment & Plan:  Other non-recurrent acute nonsuppurative otitis media of left ear Assessment & Plan: Patient presents with bilateral ear pain and left TM erythema and bulging. Will treat with Amoxicillin 500mg  BID for 7 days. Return to office if symptoms worsen or persist.   Other orders -     Amoxicillin; Take 1 capsule (500 mg total) by mouth 2 (two) times daily for 7 days.  Dispense: 14 capsule; Refill: 0     Follow up plan: Return if symptoms worsen or fail to improve.  Park Meo, FNP

## 2022-08-22 ENCOUNTER — Encounter: Payer: Self-pay | Admitting: Neurology

## 2022-08-29 ENCOUNTER — Encounter: Payer: Self-pay | Admitting: Family Medicine

## 2022-08-29 ENCOUNTER — Ambulatory Visit: Admitting: Family Medicine

## 2022-08-29 ENCOUNTER — Other Ambulatory Visit (INDEPENDENT_AMBULATORY_CARE_PROVIDER_SITE_OTHER)

## 2022-08-29 VITALS — BP 150/90 | HR 87 | Temp 97.9°F | Ht 61.0 in | Wt 270.0 lb

## 2022-08-29 DIAGNOSIS — N6322 Unspecified lump in the left breast, upper inner quadrant: Secondary | ICD-10-CM | POA: Diagnosis not present

## 2022-08-29 DIAGNOSIS — N390 Urinary tract infection, site not specified: Secondary | ICD-10-CM

## 2022-08-29 DIAGNOSIS — N898 Other specified noninflammatory disorders of vagina: Secondary | ICD-10-CM

## 2022-08-29 DIAGNOSIS — R300 Dysuria: Secondary | ICD-10-CM

## 2022-08-29 DIAGNOSIS — Z23 Encounter for immunization: Secondary | ICD-10-CM

## 2022-08-29 DIAGNOSIS — Z01411 Encounter for gynecological examination (general) (routine) with abnormal findings: Secondary | ICD-10-CM

## 2022-08-29 DIAGNOSIS — Z124 Encounter for screening for malignant neoplasm of cervix: Secondary | ICD-10-CM

## 2022-08-29 LAB — URINALYSIS, ROUTINE W REFLEX MICROSCOPIC
Bacteria, UA: NONE SEEN /HPF
Bilirubin Urine: NEGATIVE
Glucose, UA: NEGATIVE
Hgb urine dipstick: NEGATIVE
Hyaline Cast: NONE SEEN /LPF
Ketones, ur: NEGATIVE
Nitrite: NEGATIVE
Protein, ur: NEGATIVE
RBC / HPF: NONE SEEN /HPF (ref 0–2)
Specific Gravity, Urine: 1.01 (ref 1.001–1.035)
pH: 6.5 (ref 5.0–8.0)

## 2022-08-29 LAB — WET PREP FOR TRICH, YEAST, CLUE

## 2022-08-29 LAB — MICROSCOPIC MESSAGE

## 2022-08-29 MED ORDER — NITROFURANTOIN MONOHYD MACRO 100 MG PO CAPS: 100.0000 mg | ORAL_CAPSULE | Freq: Two times a day (BID) | ORAL | 0 refills | Status: DC

## 2022-08-29 NOTE — Assessment & Plan Note (Signed)
No abnormalities on mammogram earlier this year. 1.5cm palpable mass to left breast at 12 oclock noted on exam. Will order diagnostic mammogram left breast.

## 2022-08-29 NOTE — Assessment & Plan Note (Signed)
Urine dipstick shows positive for leukocytes. Wet prep with bacteria and WBCs. Will treat symptomatic pyuria with Macrobid 100mg  BID for 5 days. Return to office if symptoms persist or worsen.

## 2022-08-29 NOTE — Assessment & Plan Note (Signed)
Routine cervical cancer screening. Follow-up as indicated per test results.

## 2022-08-29 NOTE — Progress Notes (Signed)
Subjective:   Courtney Hickman is a 62 y.o. female for annual routine Pap and checkup. Current Outpatient Medications  Medication Sig Dispense Refill   albuterol (VENTOLIN HFA) 108 (90 Base) MCG/ACT inhaler Inhale 1-2 puffs into the lungs every 6 (six) hours as needed for wheezing or shortness of breath. 18 g 0   amLODipine (NORVASC) 10 MG tablet TAKE 1 TABLET(10 MG) BY MOUTH DAILY 90 tablet 3   aspirin EC 81 MG tablet Take 81 mg by mouth daily.     Cholecalciferol 50 MCG (2000 UT) CAPS Take 1 capsule (2,000 Units total) by mouth daily with breakfast. 90 capsule 3   COLLAGEN PO Take 1 capsule by mouth daily.     CRANBERRY EXTRACT PO Take 1 capsule by mouth daily.     doxazosin (CARDURA) 4 MG tablet TAKE 1 TABLET(4 MG) BY MOUTH DAILY 90 tablet 3   KRILL OIL PO Take 1 capsule by mouth daily.     losartan (COZAAR) 100 MG tablet TAKE 1 TABLET(100 MG) BY MOUTH DAILY 90 tablet 3   metoprolol succinate (TOPROL-XL) 25 MG 24 hr tablet TAKE 1 TABLET(25 MG) BY MOUTH DAILY 90 tablet 3   nitrofurantoin, macrocrystal-monohydrate, (MACROBID) 100 MG capsule Take 1 capsule (100 mg total) by mouth 2 (two) times daily. 10 capsule 0   No current facility-administered medications for this visit.   Allergies: Actifed cold-allergy [chlorpheniramine-phenyleph er] and Bactrim [sulfamethoxazole-trimethoprim]  Patient's last menstrual period was 10/16/2017 (approximate). Past Medical History:  Diagnosis Date   Anemia 08/23/2011   Dr. Elnoria Jani Ploeger: EGD, Colonoscopy, Capsule Endo--all negative Had metrorhagia at that time   Arthritis    fingers   Hearing impairment    Heart burn    occ   HOH (hard of hearing)    Hyperparathyroidism (HCC)    Hypertension    Sleep apnea    Wears hearing aid in both ears    Past Surgical History:  Procedure Laterality Date   CESAREAN SECTION     x2   COLONOSCOPY     COLONOSCOPY WITH PROPOFOL N/A 12/26/2021   Procedure: COLONOSCOPY WITH PROPOFOL;  Surgeon: Jeani Hawking, MD;   Location: [facility];  Service: Gastroenterology;  Laterality: N/A;   PARATHYROIDECTOMY Right 09/19/2018   Procedure: RIGHT PARATHYROIDECTOMY;  Surgeon: Darnell Level, MD;  Location: [facility];  Service: General;  Laterality: Right;   REFRACTIVE SURGERY  05/2022   WISDOM TOOTH EXTRACTION       ROS:  Feeling well. No dyspnea or chest pain on exertion.  No abdominal pain, change in bowel habits, black or bloody stools.  No urinary tract symptoms. GYN ROS: no breast pain or new or enlarging lumps on self exam, no vaginal bleeding, she complains of dysuria, urinary urgency and frequency. No neurological complaints.  Objective:   The patient appears well, alert, oriented x 3, in no distress. BP (!) 150/90   Pulse 87   Temp 97.9 F (36.6 C) (Oral)   Ht 5\' 1"  (1.549 m)   Wt 270 lb (122.5 kg)   LMP 10/16/2017 (Approximate)   SpO2 91%   BMI 51.02 kg/m  ENT normal.  Neck supple. No adenopathy or thyromegaly. PERLA. Lungs are clear, good air entry, no wheezes, rhonchi or rales. S1 and S2 normal, no murmurs, regular rate and rhythm. Abdomen soft without tenderness, guarding, mass or organomegaly. Extremities show no edema, normal peripheral pulses. Neurological is normal, no focal findings.  BREAST EXAM: right breast normal without mass, skin or nipple changes or  axillary nodes, abnormal mass palpable at 12 o'clock.  PELVIC EXAM: normal external genitalia, vulva, vagina, cervix, uterus and adnexa, exam limited by habitus, PAP: Pap smear done today, WET MOUNT done - results: excessive bacteria, white blood cells, exam chaperoned by Arta Silence  Assessment & Plan:   well woman  PLAN:  pap smear additional lab tests per orders return annually or prn    Cervical cancer screening Assessment & Plan: Routine cervical cancer screening. Follow-up as indicated per test results.   Orders: -     Pap, TP Imaging w/ CT/GC and w/ HPV RNA, rflx HPV Type 16/18  Dysuria -     Urinalysis, Routine w  reflex microscopic  Mass of upper inner quadrant of left breast Assessment & Plan: No abnormalities on mammogram earlier this year. 1.5cm palpable mass to left breast at 12 oclock noted on exam. Will order diagnostic mammogram left breast.  Orders: -     MM Digital Diagnostic Unilat L; Future  Vaginal itching -     WET PREP FOR TRICH, YEAST, CLUE  Urinary tract infection with pyuria Assessment & Plan: Urine dipstick shows positive for leukocytes. Wet prep with bacteria and WBCs. Will treat symptomatic pyuria with Macrobid 100mg  BID for 5 days. Return to office if symptoms persist or worsen.    Other orders -     Nitrofurantoin Monohyd Macro; Take 1 capsule (100 mg total) by mouth 2 (two) times daily.  Dispense: 10 capsule; Refill: 0     Follow up plan: Return in about 1 year (around 08/29/2023) for annual physical with labs 1 week prior.  Park Meo, FNP

## 2022-09-07 ENCOUNTER — Other Ambulatory Visit: Payer: Self-pay | Admitting: Family Medicine

## 2022-09-07 DIAGNOSIS — N6322 Unspecified lump in the left breast, upper inner quadrant: Secondary | ICD-10-CM

## 2022-09-28 ENCOUNTER — Ambulatory Visit
Admission: RE | Admit: 2022-09-28 | Discharge: 2022-09-28 | Disposition: A | Discharge status: Home / Self Care | Source: Ambulatory Visit | Attending: Family Medicine | Admitting: Family Medicine

## 2022-09-28 DIAGNOSIS — N6322 Unspecified lump in the left breast, upper inner quadrant: Secondary | ICD-10-CM

## 2022-11-01 ENCOUNTER — Other Ambulatory Visit: Payer: Self-pay | Admitting: Family Medicine

## 2022-11-01 NOTE — Telephone Encounter (Signed)
Requested by interface surescripts. Requested too soon . Last refill documented 10/28/22 #90 3 refill.  Requested Prescriptions  Refused Prescriptions Disp Refills   metoprolol succinate (TOPROL-XL) 25 MG 24 hr tablet [Pharmacy Med Name: METOPROLOL ER SUCCINATE 25MG  TABS] 90 tablet 3    Sig: TAKE 1 TABLET(25 MG) BY MOUTH DAILY     Cardiovascular:  Beta Blockers Failed - 11/01/2022  8:05 AM      Failed - Last BP in normal range    BP Readings from Last 1 Encounters:  08/29/22 (!) 150/90         Failed - Valid encounter within last 6 months    Recent Outpatient Visits           1 year ago Essential hypertension   [facility] Donita Brooks, MD   3 years ago Other chest pain   [facility] Tanya Nones, Priscille Heidelberg, MD   4 years ago Hypercalcemia   [facility] Tanya Nones, Priscille Heidelberg, MD   4 years ago Essential hypertension   [facility] Danelle Berry, PA-C   4 years ago Essential hypertension   [facility] Danelle Berry, PA-C              Passed - Last Heart Rate in normal range    Pulse Readings from Last 1 Encounters:  08/29/22 87

## 2023-01-26 ENCOUNTER — Other Ambulatory Visit: Payer: Self-pay | Admitting: Family Medicine

## 2023-01-26 DIAGNOSIS — I10 Essential (primary) hypertension: Secondary | ICD-10-CM

## 2023-08-05 NOTE — Progress Notes (Unsigned)
 [facility] [address] [place]. [place] [address] [phone]       OFFICE FOLLOW UP NOTE  Ms. Courtney Hickman Date of Birth:  07-11-1960 Medical Record Number:  [id number]    Primary neurologist: Dr. Buck Reason for visit: CPAP follow-up    SUBJECTIVE:   CHIEF COMPLAINT:  No chief complaint on file.   Follow-up visit:  Prior visit: 07/30/2022 with Dr. Buck  Brief HPI:   Courtney Hickman is a 63 y.o. female who is followed for OSA on CPAP.  Baseline sleep study 04/2022 with total AHI of 74.1/h, REM AHI 86.9/h, supine AHI 69.4/h and O2 nadir of 76% with time below or at 88% saturation was 177.6 minutes, indicating nocturnal hypoxemia.  AutoPap set up 05/2022.   At prior visit with Dr. Buck, noted excellent CPAP compliance and optimal residual AHI.  Noted improvement of daytime energy levels and gradually improving CPAP tolerance.  ESS 3/24, previously 8/24 prior to CPAP therapy.  Recommended proceeding with ONO to look for resolution of nocturnal hypoxemia on CPAP therapy.     Interval history:        ROS:   14 system review of systems performed and negative with exception of those listed in HPI  PMH:  Past Medical History:  Diagnosis Date   Anemia 08/23/2011   Dr. Rollin: EGD, Colonoscopy, Capsule Endo--all negative Had metrorhagia at that time   Arthritis    fingers   Hearing impairment    Heart burn    occ   HOH (hard of hearing)    Hyperparathyroidism (HCC)    Hypertension    Sleep apnea    Wears hearing aid in both ears     PSH:  Past Surgical History:  Procedure Laterality Date   CESAREAN SECTION     x2   COLONOSCOPY     COLONOSCOPY WITH PROPOFOL  N/A 12/26/2021   Procedure: COLONOSCOPY WITH PROPOFOL ;  Surgeon: Rollin Dover, MD;  [facility]: [facility];  Service: Gastroenterology;  Laterality: N/A;   PARATHYROIDECTOMY Right 09/19/2018   Procedure: RIGHT PARATHYROIDECTOMY;  Surgeon: Eletha Boas, MD;  Location: [facility];   Service: General;  Laterality: Right;   REFRACTIVE SURGERY  05/2022   WISDOM TOOTH EXTRACTION      Social History:  Social History   Socioeconomic History   Marital status: Married    Spouse name: Not on file   Number of children: Not on file   Years of education: Not on file   Highest education level: Not on file  Occupational History   Not on file  Tobacco Use   Smoking status: Never   Smokeless tobacco: Never  Vaping Use   Vaping status: Never Used  Substance and Sexual Activity   Alcohol use: No   Drug use: No   Sexual activity: Not Currently    Birth control/protection: None  Other Topics Concern   Not on file  Social History Narrative   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   Social Drivers of Corporate investment banker Strain: Not on file  Food Insecurity: Not on file  Transportation Needs: Not on file  Physical Activity: Not on file  Stress: Not on file  Social Connections: Not on file  Intimate Partner Violence: Not on file    Family History:  Family History  Problem Relation Age of Onset   Diabetes Mother    Hypertension Father    CVA Paternal Grandfather  Diabetes Brother    Hypertension Brother    COPD Brother    Breast cancer Neg Hx     Medications:   Current Outpatient Medications on File Prior to Visit  Medication Sig Dispense Refill   albuterol  (VENTOLIN  HFA) 108 (90 Base) MCG/ACT inhaler Inhale 1-2 puffs into the lungs every 6 (six) hours as needed for wheezing or shortness of breath. 18 g 0   amLODipine  (NORVASC ) 10 MG tablet TAKE 1 TABLET(10 MG) BY MOUTH DAILY 90 tablet 3   aspirin EC 81 MG tablet Take 81 mg by mouth daily.     Cholecalciferol  50 MCG (2000 UT) CAPS Take 1 capsule (2,000 Units total) by mouth daily with breakfast. 90 capsule 3   COLLAGEN PO Take 1 capsule by mouth daily.     CRANBERRY EXTRACT PO Take 1 capsule by mouth daily.     doxazosin  (CARDURA ) 4 MG tablet TAKE  1 TABLET(4 MG) BY MOUTH DAILY 90 tablet 3   KRILL OIL PO Take 1 capsule by mouth daily.     losartan  (COZAAR ) 100 MG tablet TAKE 1 TABLET(100 MG) BY MOUTH DAILY 90 tablet 3   metoprolol  succinate (TOPROL -XL) 25 MG 24 hr tablet TAKE 1 TABLET(25 MG) BY MOUTH DAILY 90 tablet 3   nitrofurantoin , macrocrystal-monohydrate, (MACROBID ) 100 MG capsule Take 1 capsule (100 mg total) by mouth 2 (two) times daily. 10 capsule 0   No current facility-administered medications on file prior to visit.    Allergies:   Allergies  Allergen Reactions   Actifed Cold-Allergy [Chlorpheniramine-Phenyleph Er] Anaphylaxis    Eyes hurt / headache   Bactrim [Sulfamethoxazole-Trimethoprim] Rash      OBJECTIVE:  Physical Exam  There were no vitals filed for this visit. There is no height or weight on file to calculate BMI. No results found.   General: well developed, well nourished, seated, in no evident distress Head: head normocephalic and atraumatic.   Neck: supple with no carotid or supraclavicular bruits Cardiovascular: regular rate and rhythm, no murmurs  Neurologic Exam Mental Status: Awake and fully alert. Oriented to place and time. Recent and remote memory intact. Attention span, concentration and fund of knowledge appropriate. Mood and affect appropriate.  Cranial Nerves: Pupils equal, briskly reactive to light. Extraocular movements full without nystagmus. Visual fields full to confrontation. Hearing intact. Facial sensation intact. Face, tongue, palate moves normally and symmetrically.  Motor: Normal bulk and tone. Normal strength in all tested extremity muscles Gait and Station: Arises from chair without difficulty. Stance is normal. Gait demonstrates normal stride length and balance without use of AD.         ASSESSMENT/PLAN: Courtney Hickman is a 63 y.o. year old female    OSA on CPAP :  Compliance report shows satisfactory usage with optimal residual AHI.   ONO *** Continue current  pressure settings 6-14 with EPR 3 Discussed continued nightly usage with ensuring greater than 4 hours nightly for optimal benefit and per insurance purposes.   Continue to follow with DME company for any needed supplies or CPAP related concerns CPAP set up 05/2022, due for new machine 05/2027     Follow up in *** or call earlier if needed   CC:  PCP: Duanne Butler DASEN, MD    I personally spent a total of *** minutes in the care of the patient today including {Time Based Coding:210964241}.     Harlene Bogaert, AGNP-BC  [facility] [address] [place], [place] [address]  Phone  [phone] Fax [phone] Note: This document was prepared with digital dictation and possible smart phrase technology. Any transcriptional errors that result from this process are unintentional.

## 2023-08-06 ENCOUNTER — Ambulatory Visit (INDEPENDENT_AMBULATORY_CARE_PROVIDER_SITE_OTHER): Admitting: Adult Health

## 2023-08-06 ENCOUNTER — Encounter: Payer: Self-pay | Admitting: Adult Health

## 2023-08-06 VITALS — BP 151/90 | HR 82 | Ht 61.0 in | Wt 266.0 lb

## 2023-08-06 DIAGNOSIS — G4734 Idiopathic sleep related nonobstructive alveolar hypoventilation: Secondary | ICD-10-CM

## 2023-08-06 DIAGNOSIS — G4733 Obstructive sleep apnea (adult) (pediatric): Secondary | ICD-10-CM | POA: Diagnosis not present

## 2023-08-06 NOTE — Patient Instructions (Signed)
 Your Plan:  Continue nightly use of CPAP with greater than 4 hours per night for optimal benefit and per insurance requirements  You will be contacted by your DME [employer] to be set up for an overnight oximetry test while on CPAP to ensure improvement of low oxygen levels while on CPAP therapy   Continue to follow with your DME [employer] for any needed supplies or CPAP related concerns. Please be sure to change your filter every month, your mask about every 3 months, hose about every 6 months, humidifier chamber about yearly. Some restrictions are imposed by your insurance carrier with regard to how frequently you can get certain supplies. Your DME company can provide further details if necessary.      Follow-up in 1 year or call earlier if needed     Thank you for coming to see us  at [facility]. I hope we have been able to provide you high quality care today.  You may receive a patient satisfaction survey over the next few weeks. We would appreciate your feedback and comments so that we may continue to improve ourselves and the health of our patients.

## 2023-11-01 ENCOUNTER — Other Ambulatory Visit: Payer: Self-pay | Admitting: Family Medicine

## 2024-01-26 ENCOUNTER — Other Ambulatory Visit: Payer: Self-pay | Admitting: Family Medicine

## 2024-01-26 DIAGNOSIS — I10 Essential (primary) hypertension: Secondary | ICD-10-CM

## 2024-02-04 ENCOUNTER — Other Ambulatory Visit: Payer: Self-pay | Admitting: Family Medicine

## 2024-02-04 NOTE — Telephone Encounter (Unsigned)
 Copied from CRM #[id number]. Topic: Clinical - Medication Refill >> Feb 04, 2024 12:33 PM Delon T wrote: Medication: amLODipine  (NORVASC ) 10 MG tablet  Has the patient contacted their pharmacy? Yes (Agent: If no, request that the patient contact the pharmacy for the refill. If patient does not wish to contact the pharmacy document the reason why and proceed with request.) (Agent: If yes, when and what did the pharmacy advise?)  This is the patient's preferred pharmacy:  [id number] - [place], [place] - [address] [place] [address] Phone: [phone] Fax: [phone]  Is this the correct pharmacy for this prescription? Yes If no, delete pharmacy and type the correct one.   Has the prescription been filled recently? Yes  Is the patient out of the medication? Yes  Has the patient been seen for an appointment in the last year OR does the patient have an upcoming appointment? Yes  Can we respond through MyChart? Yes  Agent: Please be advised that Rx refills may take up to 3 business days. We ask that you follow-up with your pharmacy.

## 2024-02-05 MED ORDER — AMLODIPINE BESYLATE 10 MG PO TABS: ORAL_TABLET | ORAL | 3 refills | Status: AC

## 2024-02-05 NOTE — Telephone Encounter (Signed)
 Requested medication (s) are due for refill today: expired medication date  Requested medication (s) are on the active medication list: yes   Last refill:  01/29/23 #90 3 refills  Future visit scheduled: yes 02/13/24  Notes to clinic:  expired medication date. Do you want to renew Rx or give courtesy refill # 9 tablets or #30 until next OV?     Requested Prescriptions  Pending Prescriptions Disp Refills   amLODipine  (NORVASC ) 10 MG tablet 90 tablet 3    Sig: TAKE 1 TABLET(10 MG) BY MOUTH DAILY     Cardiovascular: Calcium  Channel Blockers 2 Failed - 02/05/2024  1:27 PM      Failed - Last BP in normal range    BP Readings from Last 1 Encounters:  08/06/23 (!) 151/90         Failed - Valid encounter within last 6 months    Recent Outpatient Visits           1 year ago Other non-recurrent acute nonsuppurative otitis media of left ear   [facility] Kayla Jeoffrey RAMAN, FNP   1 year ago Benign essential HTN   [facility] Duanne, Butler DASEN, MD   2 years ago Essential hypertension   [facility] Duanne Butler DASEN, MD              Passed - Last Heart Rate in normal range    Pulse Readings from Last 1 Encounters:  08/06/23 82

## 2024-02-13 ENCOUNTER — Encounter: Payer: Self-pay | Admitting: Family Medicine

## 2024-02-13 ENCOUNTER — Ambulatory Visit: Admitting: Family Medicine

## 2024-02-13 VITALS — BP 132/70 | HR 85 | Temp 97.8°F | Ht 61.0 in | Wt 260.0 lb

## 2024-02-13 DIAGNOSIS — Z1231 Encounter for screening mammogram for malignant neoplasm of breast: Secondary | ICD-10-CM

## 2024-02-13 DIAGNOSIS — Z23 Encounter for immunization: Secondary | ICD-10-CM | POA: Diagnosis not present

## 2024-02-13 DIAGNOSIS — I10 Essential (primary) hypertension: Secondary | ICD-10-CM | POA: Diagnosis not present

## 2024-02-13 LAB — COMPREHENSIVE METABOLIC PANEL WITH GFR
AG Ratio: 1.7 (calc) (ref 1.0–2.5)
ALT: 27 U/L (ref 6–29)
AST: 17 U/L (ref 10–35)
Albumin: 4.6 g/dL (ref 3.6–5.1)
Alkaline phosphatase (APISO): 70 U/L (ref 37–153)
BUN: 15 mg/dL (ref 7–25)
CO2: 25 mmol/L (ref 20–32)
Calcium: 9.5 mg/dL (ref 8.6–10.4)
Chloride: 106 mmol/L (ref 98–110)
Creat: 0.85 mg/dL (ref 0.50–1.05)
Globulin: 2.7 g/dL (ref 1.9–3.7)
Glucose, Bld: 88 mg/dL (ref 65–99)
Potassium: 4 mmol/L (ref 3.5–5.3)
Sodium: 142 mmol/L (ref 135–146)
Total Bilirubin: 0.8 mg/dL (ref 0.2–1.2)
Total Protein: 7.3 g/dL (ref 6.1–8.1)
eGFR: 77 mL/min/1.73m2

## 2024-02-13 LAB — CBC WITH DIFFERENTIAL/PLATELET
Absolute Lymphocytes: 1258 {cells}/uL (ref 850–3900)
Absolute Monocytes: 351 {cells}/uL (ref 200–950)
Basophils Absolute: 38 {cells}/uL (ref 0–200)
Basophils Relative: 0.7 %
Eosinophils Absolute: 232 {cells}/uL (ref 15–500)
Eosinophils Relative: 4.3 %
HCT: 44.7 % (ref 35.9–46.0)
Hemoglobin: 14.9 g/dL (ref 11.7–15.5)
MCH: 29.5 pg (ref 27.0–33.0)
MCHC: 33.3 g/dL (ref 31.6–35.4)
MCV: 88.5 fL (ref 81.4–101.7)
MPV: 10.9 fL (ref 7.5–12.5)
Monocytes Relative: 6.5 %
Neutro Abs: 3521 {cells}/uL (ref 1500–7800)
Neutrophils Relative %: 65.2 %
Platelets: 222 Thousand/uL (ref 140–400)
RBC: 5.05 Million/uL (ref 3.80–5.10)
RDW: 14.2 % (ref 11.0–15.0)
Total Lymphocyte: 23.3 %
WBC: 5.4 Thousand/uL (ref 3.8–10.8)

## 2024-02-13 LAB — LIPID PANEL
Cholesterol: 190 mg/dL
HDL: 42 mg/dL — ABNORMAL LOW
LDL Cholesterol (Calc): 124 mg/dL — ABNORMAL HIGH
Non-HDL Cholesterol (Calc): 148 mg/dL — ABNORMAL HIGH
Total CHOL/HDL Ratio: 4.5 (calc)
Triglycerides: 129 mg/dL

## 2024-02-13 NOTE — Progress Notes (Signed)
 "  Subjective:    Patient ID: Courtney Hickman, female    DOB: December 16, 1960, 64 y.o.   MRN: [id number]  HPI Patient is a very sweet 64 year old Caucasian female who presents today for a checkup.  She is overdue for mammogram.  She agrees to allow me to schedule out.  She is also overdue for a Pap smear.  She declines that at the present time.  She is due for a pneumonia vaccine and she would like to receive that today.  She is here today primarily to recheck her blood pressure.  Her blood pressure is good at 132/70.  Unfortunately she weighs 260 pounds and her BMI is 49 Past Medical History:  Diagnosis Date   Anemia 08/23/2011   Dr. Rollin: EGD, Colonoscopy, Capsule Endo--all negative Had metrorhagia at that time   Arthritis    fingers   Hearing impairment    Heart burn    occ   HOH (hard of hearing)    Hyperparathyroidism    Hypertension    Sleep apnea    Wears hearing aid in both ears    Patient is hearing impaired.  This is been since birth.  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 Past Surgical History:  Procedure Laterality Date   CESAREAN SECTION     x2   COLONOSCOPY     COLONOSCOPY WITH PROPOFOL  N/A 12/26/2021   Procedure: COLONOSCOPY WITH PROPOFOL ;  Surgeon: Rollin Dover, MD;  [facility]: [facility];  Service: Gastroenterology;  Laterality: N/A;   PARATHYROIDECTOMY Right 09/19/2018   Procedure: RIGHT PARATHYROIDECTOMY;  Surgeon: Eletha Boas, MD;  Location: [facility];  Service: General;  Laterality: Right;   REFRACTIVE SURGERY  05/2022   WISDOM TOOTH EXTRACTION     Current Outpatient Medications on File Prior to Visit  Medication Sig Dispense Refill   albuterol  (VENTOLIN  HFA) 108 (90 Base) MCG/ACT inhaler Inhale 1-2 puffs into the lungs every 6 (six) hours as needed for wheezing or shortness of breath. 18 g 0   amLODipine  (NORVASC ) 10 MG tablet TAKE 1 TABLET(10 MG) BY MOUTH  DAILY 90 tablet 3   aspirin EC 81 MG tablet Take 81 mg by mouth daily.     Cholecalciferol  50 MCG (2000 UT) CAPS Take 1 capsule (2,000 Units total) by mouth daily with breakfast. 90 capsule 3   CRANBERRY EXTRACT PO Take 1 capsule by mouth daily.     doxazosin  (CARDURA ) 4 MG tablet TAKE 1 TABLET(4 MG) BY MOUTH DAILY 90 tablet 3   KRILL OIL PO Take 1 capsule by mouth daily.     losartan  (COZAAR ) 100 MG tablet TAKE 1 TABLET(100 MG) BY MOUTH DAILY 90 tablet 3   No current facility-administered medications on file prior to visit.   Allergies  Allergen Reactions   Actifed Cold-Allergy [Chlorpheniramine-Phenyleph Er] Anaphylaxis    Eyes hurt / headache   Bactrim [Sulfamethoxazole-Trimethoprim] Rash   Social History   Socioeconomic History   Marital status: Married    Spouse name: Not on file   Number of children: Not on file   Years of education: Not on file   Highest education level: Bachelor's degree (e.g., BA, AB, BS)  Occupational History   Not on file  Tobacco Use   Smoking status: Never   Smokeless tobacco: Never  Vaping Use   Vaping status: Never Used  Substance and Sexual Activity  Alcohol use: No   Drug use: No   Sexual activity: Not Currently    Birth control/protection: None  Other Topics Concern   Not on file  Social History Narrative   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   Social Drivers of Health   Tobacco Use: Low Risk (02/13/2024)   Patient History    Smoking Tobacco Use: Never    Smokeless Tobacco Use: Never    Passive Exposure: Not on file  Financial Resource Strain: Low Risk (02/12/2024)   Overall Financial Resource Strain (CARDIA)    Difficulty of Paying Living Expenses: Not very hard  Food Insecurity: No Food Insecurity (02/12/2024)   Epic    Worried About Programme Researcher, Broadcasting/film/video in the Last Year: Never true    Ran Out of Food in the Last Year: Never true  Transportation Needs: No Transportation Needs  (02/12/2024)   Epic    Lack of Transportation (Medical): No    Lack of Transportation (Non-Medical): No  Physical Activity: Inactive (02/12/2024)   Exercise Vital Sign    Days of Exercise per Week: 0 days    Minutes of Exercise per Session: Not on file  Stress: No Stress Concern Present (02/12/2024)   [employer] of Occupational Health - Occupational Stress Questionnaire    Feeling of Stress: Only a little  Social Connections: Unknown (02/12/2024)   Social Connection and Isolation Panel    Frequency of Communication with Friends and Family: Patient declined    Frequency of Social Gatherings with Friends and Family: Once a week    Attends Religious Services: More than 4 times per year    Active Member of [employer] or Organizations: Yes    Attends Banker Meetings: More than 4 times per year    Marital Status: Married  Catering Manager Violence: Not on file  Depression (PHQ2-9): Low Risk (03/01/2022)   Depression (PHQ2-9)    PHQ-2 Score: 1  Alcohol Screen: Not on file  Housing: Low Risk (02/12/2024)   Epic    Unable to Pay for Housing in the Last Year: No    Number of Times Moved in the Last Year: 0    Homeless in the Last Year: No  Utilities: Not on file  Health Literacy: Not on file      Review of Systems  All other systems reviewed and are negative.      Objective:   Physical Exam Vitals reviewed.  Constitutional:      Appearance: Normal appearance.  Cardiovascular:     Rate and Rhythm: Normal rate and regular rhythm.     Heart sounds: Normal heart sounds. No murmur heard. Pulmonary:     Effort: Pulmonary effort is normal. No respiratory distress.     Breath sounds: Normal breath sounds. No stridor. No wheezing, rhonchi or rales.  Abdominal:     General: Bowel sounds are normal.     Palpations: Abdomen is soft.  Musculoskeletal:     Cervical back: Normal range of motion.  Neurological:     Mental Status: She is alert.           Assessment &  Plan:  Essential hypertension - Plan: CBC with Differential/Platelet, Comprehensive metabolic panel with GFR, Lipid panel, CBC with Differential/Platelet, Comprehensive metabolic panel with GFR, Lipid panel  Encounter for screening mammogram for malignant neoplasm of breast - Plan: MM 3D SCREENING MAMMOGRAM BILATERAL BREAST, CANCELED: MM 3D SCREENING MAMMOGRAM BILATERAL BREAST  Immunization  due - Plan: Pneumococcal conjugate vaccine 20-valent (Prevnar 20)  Morbid obesity (HCC) I am concerned primarily about her weight.  We discussed Tzhncb.  She would like to discuss this with her daughter.  Her blood pressure is excellent.  I will check a CBC a CMP and a lipid panel.  I would like to see her LDL cholesterol less than 899.  I will schedule the patient for a mammogram.  Her colonoscopy is up-to-date.  I recommended a Pap smear but she declined that today.  She received her pneumonia vaccine "

## 2024-02-14 ENCOUNTER — Ambulatory Visit: Payer: Self-pay | Admitting: Family Medicine

## 2024-02-26 ENCOUNTER — Ambulatory Visit
Admission: RE | Admit: 2024-02-26 | Discharge: 2024-02-26 | Disposition: A | Source: Ambulatory Visit | Attending: Family Medicine | Admitting: Family Medicine

## 2024-02-26 DIAGNOSIS — Z1231 Encounter for screening mammogram for malignant neoplasm of breast: Secondary | ICD-10-CM

## 2024-03-24 ENCOUNTER — Encounter: Admitting: Family Medicine

## 2024-08-05 ENCOUNTER — Ambulatory Visit: Admitting: Adult Health
